# Patient Record
Sex: Female | Born: 1980
Health system: Southern US, Community
[De-identification: ages and names within clinical notes are randomized; demographics above are authoritative.]

## PROBLEM LIST (undated history)

## (undated) DIAGNOSIS — F329 Major depressive disorder, single episode, unspecified: Secondary | ICD-10-CM

## (undated) DIAGNOSIS — L509 Urticaria, unspecified: Secondary | ICD-10-CM

## (undated) DIAGNOSIS — T783XXA Angioneurotic edema, initial encounter: Secondary | ICD-10-CM

## (undated) DIAGNOSIS — I10 Essential (primary) hypertension: Secondary | ICD-10-CM

## (undated) DIAGNOSIS — E8881 Metabolic syndrome: Secondary | ICD-10-CM

## (undated) DIAGNOSIS — F32A Depression, unspecified: Secondary | ICD-10-CM

## (undated) DIAGNOSIS — E039 Hypothyroidism, unspecified: Secondary | ICD-10-CM

## (undated) DIAGNOSIS — M5430 Sciatica, unspecified side: Secondary | ICD-10-CM

## (undated) DIAGNOSIS — E079 Disorder of thyroid, unspecified: Secondary | ICD-10-CM

## (undated) HISTORY — DX: Morbid (severe) obesity due to excess calories: E66.01

## (undated) HISTORY — DX: Sciatica, unspecified side: M54.30

## (undated) HISTORY — DX: Hypothyroidism, unspecified: E03.9

## (undated) HISTORY — DX: Major depressive disorder, single episode, unspecified: F32.9

## (undated) HISTORY — DX: Angioneurotic edema, initial encounter: T78.3XXA

## (undated) HISTORY — DX: Metabolic syndrome: E88.81

## (undated) HISTORY — PX: WISDOM TOOTH EXTRACTION: SHX21

## (undated) HISTORY — DX: Urticaria, unspecified: L50.9

## (undated) HISTORY — DX: Metabolic syndrome: E88.810

## (undated) HISTORY — DX: Depression, unspecified: F32.A

---

## 2003-03-26 ENCOUNTER — Other Ambulatory Visit: Admission: RE | Admit: 2003-03-26 | Discharge: 2003-03-26 | Payer: Self-pay | Admitting: Obstetrics and Gynecology

## 2005-05-05 ENCOUNTER — Encounter: Admission: RE | Admit: 2005-05-05 | Discharge: 2005-05-15 | Payer: Self-pay | Admitting: Internal Medicine

## 2009-01-07 ENCOUNTER — Encounter (INDEPENDENT_AMBULATORY_CARE_PROVIDER_SITE_OTHER): Payer: Self-pay | Admitting: Internal Medicine

## 2009-01-07 ENCOUNTER — Ambulatory Visit (HOSPITAL_COMMUNITY): Admission: RE | Admit: 2009-01-07 | Discharge: 2009-01-07 | Payer: Self-pay | Admitting: Internal Medicine

## 2009-01-07 ENCOUNTER — Ambulatory Visit: Payer: Self-pay | Admitting: Cardiovascular Disease

## 2011-07-06 ENCOUNTER — Emergency Department (INDEPENDENT_AMBULATORY_CARE_PROVIDER_SITE_OTHER)
Admission: EM | Admit: 2011-07-06 | Discharge: 2011-07-06 | Disposition: A | Discharge status: Nursing Home (Includes ICF) | Payer: BC Managed Care – PPO | Source: Home / Self Care

## 2011-07-06 ENCOUNTER — Other Ambulatory Visit: Payer: Self-pay

## 2011-07-06 ENCOUNTER — Emergency Department (HOSPITAL_COMMUNITY): Payer: BC Managed Care – PPO

## 2011-07-06 ENCOUNTER — Encounter (HOSPITAL_COMMUNITY): Payer: Self-pay | Admitting: Emergency Medicine

## 2011-07-06 ENCOUNTER — Emergency Department (HOSPITAL_COMMUNITY)
Admission: EM | Admit: 2011-07-06 | Discharge: 2011-07-06 | Disposition: A | Discharge status: Home / Self Care | Payer: BC Managed Care – PPO | Attending: Emergency Medicine | Admitting: Emergency Medicine

## 2011-07-06 DIAGNOSIS — E079 Disorder of thyroid, unspecified: Secondary | ICD-10-CM | POA: Insufficient documentation

## 2011-07-06 DIAGNOSIS — I10 Essential (primary) hypertension: Secondary | ICD-10-CM | POA: Insufficient documentation

## 2011-07-06 DIAGNOSIS — R079 Chest pain, unspecified: Secondary | ICD-10-CM | POA: Insufficient documentation

## 2011-07-06 DIAGNOSIS — R0789 Other chest pain: Secondary | ICD-10-CM

## 2011-07-06 DIAGNOSIS — Z79899 Other long term (current) drug therapy: Secondary | ICD-10-CM | POA: Insufficient documentation

## 2011-07-06 HISTORY — DX: Disorder of thyroid, unspecified: E07.9

## 2011-07-06 HISTORY — DX: Morbid (severe) obesity due to excess calories: E66.01

## 2011-07-06 HISTORY — DX: Essential (primary) hypertension: I10

## 2011-07-06 LAB — CBC
MCV: 82.3 fL (ref 78.0–100.0)
Platelets: 281 10*3/uL (ref 150–400)
RBC: 5.08 MIL/uL (ref 3.87–5.11)
RDW: 14.4 % (ref 11.5–15.5)
WBC: 10.1 10*3/uL (ref 4.0–10.5)

## 2011-07-06 LAB — BASIC METABOLIC PANEL
CO2: 27 mEq/L (ref 19–32)
Chloride: 99 mEq/L (ref 96–112)
Creatinine, Ser: 0.71 mg/dL (ref 0.50–1.10)
GFR calc Af Amer: >90 mL/min (ref >90)
Potassium: 4.4 mEq/L (ref 3.5–5.1)
Sodium: 137 mEq/L (ref 135–145)

## 2011-07-06 LAB — D-DIMER, QUANTITATIVE: D-Dimer, Quant: 0.28 ug/mL-FEU (ref 0.00–0.48)

## 2011-07-06 LAB — POCT I-STAT TROPONIN I: Troponin i, poc: 0 ng/mL (ref 0.00–0.08)

## 2011-07-06 NOTE — ED Provider Notes (Signed)
Patient with intermittent episodes of chest pain for at least one month. Today, the pain was more prominent, so she decided to come here for evaluation. She has been seeing her PCP. He will regularly for treatment of thyroid disease. At this time. She states that the pain has resolved (22:40). She has no shortness of breath, dizziness, or weakness. Heart and lung exams are normal.  Patient is to be screened for PE. Due to persistent tachycardia, and obesity. If it is negative. She will be safe for discharge to followup with her PCP.  Medical screening examination/treatment/procedure(s) were conducted as a shared visit with non-physician practitioner(s) and myself.  I personally evaluated the patient during the encounter  Flint Melter, MD 07/06/11 2251

## 2011-07-06 NOTE — ED Notes (Signed)
Pt. Alert and oriented discharged to home via w/c with family, NAD noted

## 2011-07-06 NOTE — ED Notes (Signed)
PT HERE WITH ONGOING MID STERNAL ACHY PAIN RADIATING LEFT CHEST WALL AND UNDER BREAST THAT STARTED X 1 MNTH AGO BUT WORSENED LAST NIGHT WITH POSITION CHANGE WHILE SLEEPING.DENIES NUMB/TINGLING.PT WENT TO PCP LAST YR FOR SAME SX AND DIAG ENLARGED HEART/ACID REFLUX.HXHTN,MORBID OBESITY.BP ELEVATED 181/104 C/O BLURRY VISION BUT DENIES H/A.EKG DONE

## 2011-07-06 NOTE — ED Provider Notes (Signed)
Medical screening examination/treatment/procedure(s) were performed by non-physician practitioner and as supervising physician I was immediately available for consultation/collaboration.  Corrie Mckusick, MD 07/06/11 2215

## 2011-07-06 NOTE — ED Notes (Signed)
Pt from [facility] c/o CP and soreness x 1 month intermittently ; pt sts more severe x last 5 days; pt denies radiation, SOB or diaphoresis; pt sent for further eval

## 2011-07-06 NOTE — ED Provider Notes (Signed)
History     CSN: [id number]  Arrival date & time 07/06/11  1500   None     Chief Complaint  Patient presents with  . Chest Pain    (Consider location/radiation/quality/duration/timing/severity/associated sxs/prior treatment) HPI Comments: The patient presents today with complaints of intermittent soreness in chest for one month. Worse with movement. In the last 5 days it has become more of a pressure and "most of the day." She denies dyspnea, diaphoresis, or peripheral edema. She has had a mild nonproductive cough for the last one week. She also has had bilateral shoulder pain in the mornings when she awakens since December of 2012. She contributes this to sleeping on her couch. She states that she's been sleeping on her couch because she needs a new bed. She denies orthopnea. She has a history of hypertension and GERD. She denies a history of diabetes or hyperlipidemia. She also denies family history of cardiac disease. She had similar symptoms one year ago and states that she had a echocardiogram done which showed "a slightly enlarged heart". By chart review echocardiogram on 01/07/2009 showed mild LVH.  Patient states that she's restarted her omeprazole in the last 5 days and this does provide some mild relief of her symptoms.    Past Medical History  Diagnosis Date  . Hypertension   . Thyroid disease   . Morbid obesity     History reviewed. No pertinent past surgical history.  No family history on file.  History  Substance Use Topics  . Smoking status: Never Smoker   . Smokeless tobacco: Not on file  . Alcohol Use: Yes    OB History    Grav Para Term Preterm Abortions TAB SAB Ect Mult Living                  Review of Systems  Constitutional: Negative for fever, chills and fatigue.  HENT: Negative for ear pain, sore throat, rhinorrhea, sneezing, postnasal drip and sinus pressure.   Respiratory: Positive for cough. Negative for shortness of breath and wheezing.     Cardiovascular: Positive for chest pain. Negative for palpitations and leg swelling.  Gastrointestinal: Negative for nausea, vomiting and abdominal pain.    Allergies  Review of patient's allergies indicates not on file.  Home Medications   Current Outpatient Rx  Name Route Sig Dispense Refill  . LEVOTHYROXINE SODIUM 112 MCG PO TABS Oral Take 112 mcg by mouth daily.    Marland Kitchen LISINOPRIL-HYDROCHLOROTHIAZIDE 10-12.5 MG PO TABS Oral Take 1 tablet by mouth daily.    Marland Kitchen OMEPRAZOLE 20 MG PO CPDR Oral Take 20 mg by mouth daily.      BP 148/84  Pulse 133  Temp(Src) 98.9 F (37.2 C) (Oral)  Resp 21  Wt 418 lb (189.604 kg)  SpO2 95%  LMP 06/23/2011  Physical Exam  Nursing note and vitals reviewed. Constitutional: She appears well-developed and well-nourished. No distress.       Morbidly obese.  HENT:  Head: Normocephalic and atraumatic.  Right Ear: Tympanic membrane, external ear and ear canal normal.  Left Ear: Tympanic membrane, external ear and ear canal normal.  Nose: Nose normal.  Mouth/Throat: Uvula is midline, oropharynx is clear and moist and mucous membranes are normal. No oropharyngeal exudate, posterior oropharyngeal edema or posterior oropharyngeal erythema.  Neck: Neck supple.  Cardiovascular: Normal rate, regular rhythm and normal heart sounds.   Pulmonary/Chest: Effort normal and breath sounds normal. No respiratory distress. She exhibits no tenderness.  Abdominal: Normal appearance and  bowel sounds are normal. She exhibits no mass. There is no hepatosplenomegaly. There is no tenderness. There is no CVA tenderness.  Lymphadenopathy:    She has no cervical adenopathy.  Neurological: She is alert.  Skin: Skin is warm and dry.  Psychiatric: She has a normal mood and affect.    ED Course  Procedures (including critical care time)  Labs Reviewed - No data to display No results found.   1. Atypical chest pain   2. Hypertension   3. Morbid obesity       MDM   Transferred to [facility]. Atypical chest pain. Doubt cardiac but feel pt needs further evaluation due to symptoms, and risk factors. Discussed with Dr Juanetta Gosling.  EKG sinus tachycardia, possible Lt atrial enlargement, Rate [address] [place], [place] 07/06/11 1718

## 2011-07-06 NOTE — Discharge Instructions (Signed)
Chest Pain (Nonspecific) It is often hard to give a specific diagnosis for the cause of chest pain. There is always a chance that your pain could be related to something serious, such as a heart attack or a blood clot in the lungs. You need to follow up with your caregiver for further evaluation. CAUSES   Heartburn.   Pneumonia or bronchitis.   Anxiety and stress.   Inflammation around your heart (pericarditis) or lung (pleuritis or pleurisy).   A blood clot in the lung.   A collapsed lung (pneumothorax). It can develop suddenly on its own (spontaneous pneumothorax) or from injury (trauma) to the chest.  The chest wall is composed of bones, muscles, and cartilage. Any of these can be the source of the pain.  The bones can be bruised by injury.   The muscles or cartilage can be strained by coughing or overwork.   The cartilage can be affected by inflammation and become sore (costochondritis).  DIAGNOSIS  Lab tests or other studies, such as X-rays, an EKG, stress testing, or cardiac imaging, may be needed to find the cause of your pain.  TREATMENT   Treatment depends on what may be causing your chest pain. Treatment may include:   Acid blockers for heartburn.   Anti-inflammatory medicine.   Pain medicine for inflammatory conditions.   Antibiotics if an infection is present.   You may be advised to change lifestyle habits. This includes stopping smoking and avoiding caffeine and chocolate.   You may be advised to keep your head raised (elevated) when sleeping. This reduces the chance of acid going backward from your stomach into your esophagus.   Most of the time, nonspecific chest pain will improve within 2 to 3 days with rest and mild pain medicine.  HOME CARE INSTRUCTIONS   If antibiotics were prescribed, take the full amount even if you start to feel better.   For the next few days, avoid physical activities that bring on chest pain. Continue physical activities as  directed.   Do not smoke cigarettes or drink alcohol until your symptoms are gone.   Only take over-the-counter or prescription medicine for pain, discomfort, or fever as directed by your caregiver.   Follow your caregiver's suggestions for further testing if your chest pain does not go away.   Keep any follow-up appointments you made. If you do not go to an appointment, you could develop lasting (chronic) problems with pain. If there is any problem keeping an appointment, you must call to reschedule.  SEEK MEDICAL CARE IF:   You think you are having problems from the medicine you are taking. Read your medicine instructions carefully.   Your chest pain does not go away, even after treatment.   You develop a rash with blisters on your chest.  SEEK IMMEDIATE MEDICAL CARE IF:   You have increased chest pain or pain that spreads to your arm, neck, jaw, back, or belly (abdomen).   You develop shortness of breath, an increasing cough, or you are coughing up blood.   You have severe back or abdominal pain, feel sick to your stomach (nauseous) or throw up (vomit).   You develop severe weakness, fainting, or chills.   You have an oral temperature above 102 F (38.9 C), not controlled by medicine.  THIS IS AN EMERGENCY. Do not wait to see if the pain will go away. Get medical help at once. Call your local emergency services (911 in [place] Do not drive yourself to   the hospital. MAKE SURE YOU:   Understand these instructions.   Will watch your condition.   Will get help right away if you are not doing well or get worse.  Document Released: 02/09/2005 Document Revised: 01/12/2011 Document Reviewed: 12/06/2007 [employer] Patient Information 2012 [employer]. 

## 2011-07-06 NOTE — ED Provider Notes (Signed)
History     CSN: [id number]  Arrival date & time 07/06/11  1631   First MD Initiated Contact with Patient 07/06/11 2009      Chief Complaint  Patient presents with  . Chest Pain    (Consider location/radiation/quality/duration/timing/severity/associated sxs/prior treatment) HPI  Patient presents to the emergency department from the urgent care with complaints of chest pain x1 month. Patient has a history of GERD, and has been taking omeprazole for this problem however it has not been helping. She says that her pain is worse with movement, and over the past 5 days it has become more as a pressure. She denies dyspnea, diaphoresis, or peripheral edema. She has admitted to having a cough this past week. She states that she has been sleeping on the couch and denies orthopnea. Patient has a significant medical history of hypertension and GERD and she denies a family history of cardiac disease she denies having diabetes. Patient states that she had echocardiogram done in 2010 which showed that she had an enlarged heart. Chest pain resolved before coming to the ED  Past Medical History  Diagnosis Date  . Hypertension   . Thyroid disease   . Morbid obesity     History reviewed. No pertinent past surgical history.  History reviewed. No pertinent family history.  History  Substance Use Topics  . Smoking status: Never Smoker   . Smokeless tobacco: Not on file  . Alcohol Use: Yes    OB History    Grav Para Term Preterm Abortions TAB SAB Ect Mult Living                  Review of Systems  All other systems reviewed and are negative.    Allergies  Review of patient's allergies indicates no known allergies.  Home Medications   Current Outpatient Rx  Name Route Sig Dispense Refill  . LEVOTHYROXINE SODIUM 112 MCG PO TABS Oral Take 112 mcg by mouth daily.    Marland Kitchen LISINOPRIL-HYDROCHLOROTHIAZIDE 10-12.5 MG PO TABS Oral Take 1 tablet by mouth daily.    Marland Kitchen OMEPRAZOLE 20 MG PO CPDR Oral  Take 20 mg by mouth daily.      BP 161/108  Pulse 118  Temp(Src) 98.3 F (36.8 C) (Oral)  Resp 19  SpO2 98%  LMP 06/23/2011  Physical Exam  Nursing note and vitals reviewed. Constitutional: She is oriented to person, place, and time. She appears well-developed and well-nourished. No distress.  HENT:  Head: Normocephalic and atraumatic.  Eyes: Conjunctivae are normal. Pupils are equal, round, and reactive to light.  Neck: Trachea normal, normal range of motion and full passive range of motion without pain. Neck supple.  Cardiovascular: Normal rate, regular rhythm and normal pulses.   Pulmonary/Chest: Effort normal and breath sounds normal. She has no wheezes. She has no rales. Chest wall is not dull to percussion. She exhibits no tenderness, no crepitus, no edema, no deformity and no retraction.  Abdominal: Soft. Normal appearance.  Musculoskeletal: Normal range of motion.  Neurological: She is oriented to person, place, and time. She has normal strength.  Skin: Skin is warm, dry and intact. She is not diaphoretic.  Psychiatric: Her speech is normal. Cognition and memory are normal.    ED Course  Procedures (including critical care time)  Labs Reviewed  BASIC METABOLIC PANEL - Abnormal; Notable for the following:    Glucose, Bld 114 (*)    All other components within normal limits  CBC  POCT I-STAT TROPONIN I  D-DIMER, QUANTITATIVE   Dg Chest 2 View  07/06/2011  *RADIOLOGY REPORT*  Clinical Data: Mid chest pain for the past month.  CHEST - 2 VIEW  Comparison: None.  Findings: Normal sized heart.  Clear lungs with normal vascularity. Normal appearing bones.  IMPRESSION: Normal examination.  Original Report Authenticated By: Darrol Angel, M.D.     1. Chest pain       MDM  Patient is to followup with her primary care provider. D-dimer and troponin negative. Patient also examined by Dr. Effie Shy agrees with my treatment and plan.        Dorthula Matas,  PA 07/06/11 2316

## 2011-07-07 NOTE — ED Provider Notes (Signed)
Medical screening examination/treatment/procedure(s) were conducted as a shared visit with non-physician practitioner(s) and myself.  I personally evaluated the patient during the encounter  Flint Melter, MD 07/07/11 [phone]

## 2011-10-17 ENCOUNTER — Telehealth: Payer: Self-pay | Admitting: Obstetrics and Gynecology

## 2011-10-17 NOTE — Telephone Encounter (Signed)
Patients cht has already been pulled from storage

## 2011-11-14 ENCOUNTER — Ambulatory Visit (INDEPENDENT_AMBULATORY_CARE_PROVIDER_SITE_OTHER): Payer: BC Managed Care – PPO | Admitting: Obstetrics and Gynecology

## 2011-11-14 ENCOUNTER — Encounter: Payer: Self-pay | Admitting: Obstetrics and Gynecology

## 2011-11-14 VITALS — BP 110/78 | HR 98 | Ht 63.0 in | Wt >= 6400 oz

## 2011-11-14 DIAGNOSIS — Z01419 Encounter for gynecological examination (general) (routine) without abnormal findings: Secondary | ICD-10-CM

## 2011-11-14 DIAGNOSIS — Z124 Encounter for screening for malignant neoplasm of cervix: Secondary | ICD-10-CM

## 2011-11-14 DIAGNOSIS — E039 Hypothyroidism, unspecified: Secondary | ICD-10-CM

## 2011-11-14 NOTE — Progress Notes (Signed)
Last Pap: 2002 per pt WNL: Yes Regular Periods:yes Contraception: abstinence   Monthly Breast exam:yes Tetanus<4yrs:no Nl.Bladder Function:yes Daily BMs:yes Healthy Diet:no Pt states she is starting Calcium:no Mammogram:no Date of Mammogram:  Exercise:yes, Pt states she has started  Have often Exercise: twice per week  Seatbelt: no, occasionally per pt  Abuse at home: no Stressful work:no Sigmoid-colonoscopy: n/a Bone Density: No PCP: Dr. Allyne Gee  Change in PMH: n/a Change in FMH:n/a   No complaints  Filed Vitals:   11/14/11 0938  BP: 110/78  Pulse: 98   ROS: noncontributory  Physical Examination: General appearance - alert, well appearing, and in no distress Neck - supple, no significant adenopathy Chest - clear to auscultation, no wheezes, rales or rhonchi, symmetric air entry Heart - normal rate and regular rhythm Abdomen - soft, nontender, nondistended, no masses or organomegaly Breasts - breasts appear normal, no suspicious masses, no skin or nipple changes or axillary nodes Pelvic - normal external genitalia, vulva, vagina, cervix, difficult to palpate uterus and adnexa secondary to habitus Back exam - no CVAT Extremities - no edema, redness or tenderness in the calves or thighs  A/P Morbid obesity Rec u/s to eval ovaries secondary to habitus RTO after u/s

## 2011-11-15 LAB — PAP IG W/ RFLX HPV ASCU

## 2011-12-06 ENCOUNTER — Other Ambulatory Visit: Payer: BC Managed Care – PPO

## 2011-12-06 ENCOUNTER — Encounter: Payer: BC Managed Care – PPO | Admitting: Obstetrics and Gynecology

## 2012-05-13 ENCOUNTER — Encounter (HOSPITAL_COMMUNITY): Payer: Self-pay | Admitting: Emergency Medicine

## 2012-05-13 DIAGNOSIS — R079 Chest pain, unspecified: Secondary | ICD-10-CM | POA: Insufficient documentation

## 2012-05-13 DIAGNOSIS — Z79899 Other long term (current) drug therapy: Secondary | ICD-10-CM | POA: Insufficient documentation

## 2012-05-13 DIAGNOSIS — E039 Hypothyroidism, unspecified: Secondary | ICD-10-CM | POA: Insufficient documentation

## 2012-05-13 DIAGNOSIS — I10 Essential (primary) hypertension: Secondary | ICD-10-CM | POA: Insufficient documentation

## 2012-05-13 NOTE — ED Notes (Signed)
Pt c/o upper chest pain onset 4 days ago.  Also st's she has had a non-productive cough.  Pt denies shortness of breath, nausea, vomiting or diaphoresis

## 2012-05-14 ENCOUNTER — Emergency Department (HOSPITAL_COMMUNITY): Payer: BC Managed Care – PPO

## 2012-05-14 ENCOUNTER — Emergency Department (HOSPITAL_COMMUNITY)
Admission: EM | Admit: 2012-05-14 | Discharge: 2012-05-14 | Disposition: A | Discharge status: Home / Self Care | Payer: BC Managed Care – PPO | Attending: Emergency Medicine | Admitting: Emergency Medicine

## 2012-05-14 DIAGNOSIS — R079 Chest pain, unspecified: Secondary | ICD-10-CM

## 2012-05-14 LAB — BASIC METABOLIC PANEL
BUN: 8 mg/dL (ref 6–23)
Calcium: 9.5 mg/dL (ref 8.4–10.5)
Chloride: 99 mEq/L (ref 96–112)
Creatinine, Ser: 0.76 mg/dL (ref 0.50–1.10)
GFR calc Af Amer: >90 mL/min (ref >90)

## 2012-05-14 LAB — CBC
HCT: 40.9 % (ref 36.0–46.0)
MCH: 27.7 pg (ref 26.0–34.0)
MCHC: 32.5 g/dL (ref 30.0–36.0)
MCV: 85 fL (ref 78.0–100.0)
Platelets: 244 10*3/uL (ref 150–400)
RDW: 14.2 % (ref 11.5–15.5)

## 2012-05-14 MED ORDER — IBUPROFEN 800 MG PO TABS: 800.0000 mg | ORAL_TABLET | Freq: Three times a day (TID) | ORAL | Status: DC

## 2012-05-14 MED ORDER — BENZONATATE 100 MG PO CAPS
200.0000 mg | ORAL_CAPSULE | Freq: Once | ORAL | Status: AC
  Administered 2012-05-14: 200 mg via ORAL
  Filled 2012-05-14: qty 2

## 2012-05-14 MED ORDER — BENZONATATE 100 MG PO CAPS: 100.0000 mg | ORAL_CAPSULE | Freq: Three times a day (TID) | ORAL | Status: DC

## 2012-05-14 NOTE — ED Provider Notes (Signed)
History     CSN: [id number]  Arrival date & time 05/13/12  2344   First MD Initiated Contact with Patient 05/14/12 0110      Chief Complaint  Patient presents with  . Chest Pain    (Consider location/radiation/quality/duration/timing/severity/associated sxs/prior treatment) HPI Hx per PT, woke up this am and then developed on and off sharp chest pains substernal, has had this before and has had echo in the past, has never had a stress test. Pain lasts seconds and goes away, no radiation of pain, some dry cough no fevers. No FH of CAD, is a non smoker, is treated for HTN. No h/o DM or HLD. Followed by Dr Allyne Gee, PT was told she may have anxiety. No known aggrevating or alleviating factors. No leg pain or swelling. No pain currently.  Past Medical History  Diagnosis Date  . Hypertension   . Thyroid disease   . Morbid obesity   . Hypothyroidism   . Obesity, morbid   . Dysmetabolic syndrome     Past Surgical History  Procedure Date  . Wisdom tooth extraction     Family History  Problem Relation Age of Onset  . Hypertension Mother   . Migraines Mother   . Autoimmune disease Mother   . Cancer Maternal Grandfather     Colon  . Cancer Paternal Grandmother     Breast  . Diabetes Paternal Uncle   . Kidney disease Paternal Uncle   . Diabetes Paternal Aunt   . Migraines Maternal Aunt     History  Substance Use Topics  . Smoking status: Never Smoker   . Smokeless tobacco: Not on file  . Alcohol Use: Yes     Comment: rarely     OB History    Grav Para Term Preterm Abortions TAB SAB Ect Mult Living   0               Review of Systems  Constitutional: Negative for fever and chills.  HENT: Negative for neck pain and neck stiffness.   Eyes: Negative for pain.  Respiratory: Negative for chest tightness and shortness of breath.   Cardiovascular: Positive for chest pain.  Gastrointestinal: Negative for abdominal pain.  Genitourinary: Negative for dysuria.    Musculoskeletal: Negative for back pain.  Skin: Negative for rash.  Neurological: Negative for headaches.  All other systems reviewed and are negative.    Allergies  Review of patient's allergies indicates no known allergies.  Home Medications   Current Outpatient Rx  Name  Route  Sig  Dispense  Refill  . LEVOTHYROXINE SODIUM 112 MCG PO TABS   Oral   Take 112 mcg by mouth daily.         Marland Kitchen LISINOPRIL-HYDROCHLOROTHIAZIDE 10-12.5 MG PO TABS   Oral   Take 1 tablet by mouth daily.         Marland Kitchen OMEPRAZOLE 20 MG PO CPDR   Oral   Take 20 mg by mouth daily.         Marland Kitchen PHENTERMINE-TOPIRAMATE 3.75-23 MG PO CP24   Oral   Take by mouth.           BP 131/97  Pulse 105  Temp 98.7 F (37.1 C) (Oral)  Resp 18  SpO2 97%  LMP 05/13/2012  Physical Exam  Constitutional: She is oriented to person, place, and time. She appears well-developed and well-nourished.  HENT:  Head: Normocephalic and atraumatic.  Eyes: Conjunctivae normal and EOM are normal. Pupils are equal, round, and  reactive to light.  Neck: Trachea normal. Neck supple. No thyromegaly present.  Cardiovascular: Normal rate, regular rhythm, S1 normal, S2 normal and normal pulses.     No systolic murmur is present   No diastolic murmur is present  Pulses:      Radial pulses are 2+ on the right side, and 2+ on the left side.  Pulmonary/Chest: Effort normal and breath sounds normal. She has no wheezes. She has no rhonchi. She has no rales. She exhibits no tenderness.  Abdominal: Soft. Normal appearance and bowel sounds are normal. There is no tenderness. There is no CVA tenderness and negative Murphy's sign.  Musculoskeletal:       BLE:s Calves nontender, no cords or erythema, negative Homans sign  Neurological: She is alert and oriented to person, place, and time. She has normal strength. No cranial nerve deficit or sensory deficit. GCS eye subscore is 4. GCS verbal subscore is 5. GCS motor subscore is 6.  Skin: Skin  is warm and dry. No rash noted. She is not diaphoretic.  Psychiatric: Her speech is normal.       Cooperative and appropriate    ED Course  Procedures (including critical care time)  Results for orders placed during the hospital encounter of 05/14/12  TROPONIN I      Component Value Range   Troponin I <0.30  <0.30 ng/mL  CBC      Component Value Range   WBC 7.8  4.0 - 10.5 K/uL   RBC 4.81  3.87 - 5.11 MIL/uL   Hemoglobin 13.3  12.0 - 15.0 g/dL   HCT 84.1  32.4 - 40.1 %   MCV 85.0  78.0 - 100.0 fL   MCH 27.7  26.0 - 34.0 pg   MCHC 32.5  30.0 - 36.0 g/dL   RDW 02.7  25.3 - 66.4 %   Platelets 244  150 - 400 K/uL  BASIC METABOLIC PANEL      Component Value Range   Sodium 138  135 - 145 mEq/L   Potassium 3.7  3.5 - 5.1 mEq/L   Chloride 99  96 - 112 mEq/L   CO2 29  19 - 32 mEq/L   Glucose, Bld 114 (*) 70 - 99 mg/dL   BUN 8  6 - 23 mg/dL   Creatinine, Ser 4.03  0.50 - 1.10 mg/dL   Calcium 9.5  8.4 - 47.4 mg/dL   GFR calc non Af Amer >90  >90 mL/min   GFR calc Af Amer >90  >90 mL/min   Dg Chest 2 View  05/14/2012  *RADIOLOGY REPORT*  Clinical Data: Bilateral chest pain and right arm, shoulder and neck pain.  CHEST - 2 VIEW  Comparison: Chest radiograph from 07/06/2011  Findings: The lungs are well-aerated and clear.  There is no evidence of focal opacification, pleural effusion or pneumothorax.  The heart is normal in size; the mediastinal contour is within normal limits.  No acute osseous abnormalities are seen.  IMPRESSION: No acute cardiopulmonary process seen.   Original Report Authenticated By: Tonia Ghent, M.D.       Date: 05/14/2012  Rate: 103   Rhythm: sinus tachycardia  QRS Axis: normal  Intervals: normal  ST/T Wave abnormalities: nonspecific ST/T changes  Conduction Disutrbances:none  Narrative Interpretation:   Old EKG Reviewed: none available   Tessalon Perles provided for cough. Chest x-ray, EKG and labs reviewed. On recheck feels much better and has not  had any recurrent chest pain in the emergency department.  plan discharge home followup with primary care physician for recheck and to schedule outpatient stress testing. Prescription for Tessalon provided. No fevers, sore throat or clinical influenza MDM       CP - sharp and intermittent, does not suggest ACS. Has some dry cough but no there URI symptoms. Evaluated with ECG, CXR, and labs as above. Tessalon provided. Vital Signs and nursing notes reviewed.    Sunnie Nielsen, MD 05/14/12 [phone]

## 2012-06-01 ENCOUNTER — Encounter (HOSPITAL_COMMUNITY): Payer: Self-pay | Admitting: *Deleted

## 2012-06-01 ENCOUNTER — Emergency Department (HOSPITAL_COMMUNITY)
Admission: EM | Admit: 2012-06-01 | Discharge: 2012-06-01 | Disposition: A | Discharge status: Home / Self Care | Payer: BC Managed Care – PPO | Source: Home / Self Care

## 2012-06-01 DIAGNOSIS — L03019 Cellulitis of unspecified finger: Secondary | ICD-10-CM

## 2012-06-01 MED ORDER — AMOXICILLIN-POT CLAVULANATE 875-125 MG PO TABS: 1.0000 | ORAL_TABLET | Freq: Two times a day (BID) | ORAL | Status: DC

## 2012-06-01 MED ORDER — HYDROCODONE-ACETAMINOPHEN 7.5-325 MG PO TABS: 1.0000 | ORAL_TABLET | Freq: Four times a day (QID) | ORAL | Status: DC | PRN

## 2012-06-01 NOTE — ED Provider Notes (Signed)
Medical screening examination/treatment/procedure(s) were performed by resident physician or non-physician practitioner and as supervising physician I was immediately available for consultation/collaboration.   Barkley Bruns MD.    Linna Hoff, MD 06/01/12 2028

## 2012-06-01 NOTE — ED Notes (Signed)
Patient not triaged by this nurse

## 2012-06-01 NOTE — ED Notes (Signed)
Pt has infected right index finger - placed in iodine soak at triage

## 2012-06-01 NOTE — ED Provider Notes (Addendum)
History     CSN: [id number]  Arrival date & time 06/01/12  1005   First MD Initiated Contact with Patient 06/01/12 1031      Chief Complaint  Patient presents with  . Wound Infection    (Consider location/radiation/quality/duration/timing/severity/associated sxs/prior treatment) HPI Comments: [age] female states that she bites her nails. Last night she noticed discomfort to the left distal index finger and this morning upon awakening she noticed there was swelling erythema and pain around the nail of the index finger. The redness and swelling does not extend to the IP joint and it is not circumferential. No evidence of felon. Denies constitutional symptoms. Distal neurovascular and motor sensory is intact.   Past Medical History  Diagnosis Date  . Hypertension   . Thyroid disease   . Morbid obesity   . Hypothyroidism   . Obesity, morbid   . Dysmetabolic syndrome     Past Surgical History  Procedure Date  . Wisdom tooth extraction     Family History  Problem Relation Age of Onset  . Hypertension Mother   . Migraines Mother   . Autoimmune disease Mother   . Cancer Maternal Grandfather     Colon  . Cancer Paternal Grandmother     Breast  . Diabetes Paternal Uncle   . Kidney disease Paternal Uncle   . Diabetes Paternal Aunt   . Migraines Maternal Aunt     History  Substance Use Topics  . Smoking status: Never Smoker   . Smokeless tobacco: Not on file  . Alcohol Use: Yes     Comment: rarely     OB History    Grav Para Term Preterm Abortions TAB SAB Ect Mult Living   0               Review of Systems  Cardiovascular: Negative for chest pain.  Genitourinary: Negative.   Musculoskeletal: Negative.   Skin:       As per history of present illness  Neurological: Negative.   All other systems reviewed and are negative.    Allergies  Review of patient's allergies indicates no known allergies.  Home Medications   Current Outpatient Rx  Name  Route  Sig   Dispense  Refill  . AMOXICILLIN-POT CLAVULANATE 875-125 MG PO TABS   Oral   Take 1 tablet by mouth every 12 (twelve) hours.   14 tablet   0   . BENZONATATE 100 MG PO CAPS   Oral   Take 1 capsule (100 mg total) by mouth every 8 (eight) hours.   21 capsule   0   . GUAIFENESIN 100 MG/5ML PO LIQD   Oral   Take 200 mg by mouth 3 (three) times daily as needed. For cough and congestion         . HYDROCODONE-ACETAMINOPHEN 7.5-325 MG PO TABS   Oral   Take 1 tablet by mouth every 6 (six) hours as needed for pain.   12 tablet   0   . IBUPROFEN 800 MG PO TABS   Oral   Take 1 tablet (800 mg total) by mouth 3 (three) times daily.   21 tablet   0   . LEVOTHYROXINE SODIUM 112 MCG PO TABS   Oral   Take 112 mcg by mouth daily.         Marland Kitchen LISINOPRIL-HYDROCHLOROTHIAZIDE 10-12.5 MG PO TABS   Oral   Take 1 tablet by mouth daily.         Marland Kitchen  OMEPRAZOLE 20 MG PO CPDR   Oral   Take 20 mg by mouth daily.         Marland Kitchen PHENTERMINE-TOPIRAMATE 3.75-23 MG PO CP24   Oral   Take by mouth.         Marland Kitchen VITAMIN D (ERGOCALCIFEROL) 50000 UNITS PO CAPS   Oral   Take 50,000 Units by mouth 2 (two) times a week.           LMP 05/13/2012  Physical Exam  Constitutional: She is oriented to person, place, and time. She appears well-developed and well-nourished. No distress.  Neck: Normal range of motion. Neck supple.  Pulmonary/Chest: Effort normal.  Musculoskeletal: Normal range of motion.  Neurological: She is alert and oriented to person, place, and time. She exhibits normal muscle tone.  Skin: Skin is warm and dry.       Fluctuant swelling along the cuticle of the nail as well as the ulnar aspect of the fingernail. There is mild swelling and erythema does not extend beyond 50% of the distal phalanx. Flexion and extension of the distal phalanx is intact the    ED Course  Drain paronychia Date/Time: 06/01/2012 11:13 AM Performed by: Phineas Real, Verdine Grenfell Authorized by: Bradd Canary D Consent:  Verbal consent obtained. Risks and benefits: risks, benefits and alternatives were discussed Consent given by: patient Patient understanding: patient states understanding of the procedure being performed Patient identity confirmed: verbally with patient Preparation: Patient was prepped and draped in the usual sterile fashion. Local anesthesia used: yes Anesthesia: local infiltration Local anesthetic: lidocaine 2% without epinephrine Patient sedated: no Patient tolerance: Patient tolerated the procedure well with no immediate complications. Comments: 2 separate pockets of purulence or drainage. One at the cuticle separate warm and the older aspect of the nail. A copious amount of purulence was expressed along with blood.   (including critical care time)  Labs Reviewed - No data to display No results found.   1. Paronychia of finger       MDM  Augmentin 875 twice a day for 7 day Norco 7.5 Q4H when necessary pain #12 Soak the finger in warm salty water 2-4 times a day for the next 2-3 days. For any worsening new symptoms problems or not responding to the antibiotics may return. 06/04/12. Culture grew MRSA. Will call pt and have her stop the Augmentin and start Septra DS 1 bid for 10 days. The Rx was e-scribed by [facility] Verbal and written instructions given to [facility].         Hayden Rasmussen, NP 06/01/12 1115  Hayden Rasmussen, NP 06/04/12 1949  Hayden Rasmussen, NP 06/04/12 1954

## 2012-06-03 LAB — CULTURE, ROUTINE-ABSCESS

## 2012-06-04 ENCOUNTER — Telehealth (HOSPITAL_COMMUNITY): Payer: Self-pay | Admitting: *Deleted

## 2012-06-04 MED ORDER — SULFAMETHOXAZOLE-TRIMETHOPRIM 800-160 MG PO TABS: 1.0000 | ORAL_TABLET | Freq: Two times a day (BID) | ORAL | Status: DC

## 2012-06-04 NOTE — ED Notes (Signed)
Abscess culture L finger: Abundant MRSA.  Hayden Rasmussen NP wants pt. to stop Augmentin and start Septra DS.  Rx. E-prescribed to [facility] on [address]. I called pt.  Pt. verified x 2 and given results. Pt. Given NP's instructions and MRSA instructions. Pt. told where to get her Rx. She said they are closed now but will pick it up in the AM. Pt.'s questions answered. Pt. voiced understanding. Vassie Moselle 06/04/2012

## 2012-06-05 NOTE — ED Provider Notes (Signed)
Medical screening examination/treatment/procedure(s) were performed by resident physician or non-physician practitioner and as supervising physician I was immediately available for consultation/collaboration.   KINDL,JAMES DOUGLAS MD.    James D Kindl, MD 06/05/12 1610 

## 2012-06-05 NOTE — ED Notes (Signed)
Call from patient, advised to use Rx as advised, and to be rechecked if any further issues

## 2012-06-15 NOTE — ED Notes (Signed)
Patient called w questions regarding MRSA; discussed self care and future MRSA occurances

## 2012-06-30 ENCOUNTER — Other Ambulatory Visit: Payer: Self-pay

## 2013-03-21 ENCOUNTER — Other Ambulatory Visit: Payer: Self-pay

## 2013-05-28 ENCOUNTER — Emergency Department (HOSPITAL_COMMUNITY)
Admission: EM | Admit: 2013-05-28 | Discharge: 2013-05-28 | Disposition: A | Discharge status: Home / Self Care | Payer: BC Managed Care – PPO | Source: Home / Self Care | Attending: Family Medicine | Admitting: Family Medicine

## 2013-05-28 ENCOUNTER — Encounter (HOSPITAL_COMMUNITY): Payer: Self-pay | Admitting: Emergency Medicine

## 2013-05-28 DIAGNOSIS — J111 Influenza due to unidentified influenza virus with other respiratory manifestations: Secondary | ICD-10-CM

## 2013-05-28 DIAGNOSIS — R69 Illness, unspecified: Principal | ICD-10-CM

## 2013-05-28 MED ORDER — GUAIFENESIN-CODEINE 100-10 MG/5ML PO SYRP: 10.0000 mL | ORAL_SOLUTION | Freq: Four times a day (QID) | ORAL | Status: DC | PRN

## 2013-05-28 NOTE — ED Provider Notes (Signed)
CSN: [id number]     Arrival date & time 05/28/13  1149 History   First MD Initiated Contact with Patient 05/28/13 1249     Chief Complaint  Patient presents with  . URI   (Consider location/radiation/quality/duration/timing/severity/associated sxs/prior Treatment) Patient is a 33 y.o. female presenting with URI. The history is provided by the patient.  URI Presenting symptoms: congestion, cough, fever and rhinorrhea   Severity:  Mild Onset quality:  Sudden Duration:  5 days Associated symptoms: myalgias   Risk factors: sick contacts     Past Medical History  Diagnosis Date  . Hypertension   . Thyroid disease   . Morbid obesity   . Hypothyroidism   . Obesity, morbid   . Dysmetabolic syndrome    Past Surgical History  Procedure Laterality Date  . Wisdom tooth extraction     Family History  Problem Relation Age of Onset  . Hypertension Mother   . Migraines Mother   . Autoimmune disease Mother   . Cancer Maternal Grandfather     Colon  . Cancer Paternal Grandmother     Breast  . Diabetes Paternal Uncle   . Kidney disease Paternal Uncle   . Diabetes Paternal Aunt   . Migraines Maternal Aunt    History  Substance Use Topics  . Smoking status: Never Smoker   . Smokeless tobacco: Not on file  . Alcohol Use: Yes     Comment: rarely    OB History   Grav Para Term Preterm Abortions TAB SAB Ect Mult Living   0              Review of Systems  Constitutional: Positive for fever.  HENT: Positive for congestion and rhinorrhea.   Respiratory: Positive for cough.   Cardiovascular: Negative.   Gastrointestinal: Negative.   Genitourinary: Negative.   Musculoskeletal: Positive for myalgias.    Allergies  Review of patient's allergies indicates no known allergies.  Home Medications   Current Outpatient Rx  Name  Route  Sig  Dispense  Refill  . levothyroxine (SYNTHROID, LEVOTHROID) 112 MCG tablet   Oral   Take 112 mcg by mouth daily.         Marland Kitchen  lisinopril-hydrochlorothiazide (PRINZIDE,ZESTORETIC) 10-12.5 MG per tablet   Oral   Take 1 tablet by mouth daily.         Marland Kitchen omeprazole (PRILOSEC) 20 MG capsule   Oral   Take 20 mg by mouth daily.         . Phentermine-Topiramate (QSYMIA) 3.75-23 MG CP24   Oral   Take by mouth.         . Vitamin D, Ergocalciferol, (DRISDOL) 50000 UNITS CAPS   Oral   Take 50,000 Units by mouth 2 (two) times a week.         Marland Kitchen amoxicillin-clavulanate (AUGMENTIN) 875-125 MG per tablet   Oral   Take 1 tablet by mouth every 12 (twelve) hours.   14 tablet   0   . benzonatate (TESSALON) 100 MG capsule   Oral   Take 1 capsule (100 mg total) by mouth every 8 (eight) hours.   21 capsule   0   . guaiFENesin (ROBITUSSIN) 100 MG/5ML liquid   Oral   Take 200 mg by mouth 3 (three) times daily as needed. For cough and congestion         . guaiFENesin-codeine (ROBITUSSIN AC) 100-10 MG/5ML syrup   Oral   Take 10 mLs by mouth 4 (four) times daily as  needed for cough.   180 mL   0   . HYDROcodone-acetaminophen (NORCO) 7.5-325 MG per tablet   Oral   Take 1 tablet by mouth every 6 (six) hours as needed for pain.   12 tablet   0   . ibuprofen (ADVIL,MOTRIN) 800 MG tablet   Oral   Take 1 tablet (800 mg total) by mouth 3 (three) times daily.   21 tablet   0   . sulfamethoxazole-trimethoprim (SEPTRA DS) 800-160 MG per tablet   Oral   Take 1 tablet by mouth 2 (two) times daily. X 10 days   20 tablet   0    BP 117/55  Pulse 97  Temp(Src) 98.8 F (37.1 C) (Oral)  Resp 24  SpO2 97%  LMP 05/03/2013 Physical Exam  Nursing note and vitals reviewed. Constitutional: She is oriented to person, place, and time. She appears well-developed and well-nourished.  HENT:  Right Ear: External ear normal.  Left Ear: External ear normal.  Mouth/Throat: Oropharynx is clear and moist.  Neck: Normal range of motion. Neck supple.  Pulmonary/Chest: Effort normal and breath sounds normal.   Lymphadenopathy:    She has no cervical adenopathy.  Neurological: She is alert and oriented to person, place, and time.  Skin: Skin is warm and dry.    ED Course  Procedures (including critical care time) Labs Review Labs Reviewed - No data to display Imaging Review No results found.  EKG Interpretation    Date/Time:    Ventricular Rate:    PR Interval:    QRS Duration:   QT Interval:    QTC Calculation:   R Axis:     Text Interpretation:              MDM      Linna HoffJames D Perry Brucato, MD 05/28/13 1400

## 2013-05-28 NOTE — Discharge Instructions (Signed)
Drink plenty of fluids as discussed, use medicine as prescribed and mucinex  for cough. Return or see your doctor if further problems

## 2013-05-28 NOTE — ED Notes (Signed)
C/o  Fever on 1/8-1/10.   Nonproductive cough.  Sneezing.  Sinus pressure and pain.  Pt has used Robitussin DM, day/night quill with mild relief in symptoms.  Denies n/v/d

## 2016-01-05 ENCOUNTER — Institutional Professional Consult (permissible substitution): Payer: BLUE CROSS/BLUE SHIELD | Admitting: Neurology

## 2016-02-02 ENCOUNTER — Ambulatory Visit (INDEPENDENT_AMBULATORY_CARE_PROVIDER_SITE_OTHER): Payer: BLUE CROSS/BLUE SHIELD | Admitting: Neurology

## 2016-02-02 ENCOUNTER — Encounter: Payer: Self-pay | Admitting: Neurology

## 2016-02-02 VITALS — BP 124/88 | HR 100 | Resp 20 | Ht 63.0 in | Wt >= 6400 oz

## 2016-02-02 DIAGNOSIS — E662 Morbid (severe) obesity with alveolar hypoventilation: Secondary | ICD-10-CM | POA: Diagnosis not present

## 2016-02-02 DIAGNOSIS — E669 Obesity, unspecified: Secondary | ICD-10-CM

## 2016-02-02 DIAGNOSIS — G4730 Sleep apnea, unspecified: Secondary | ICD-10-CM | POA: Diagnosis not present

## 2016-02-02 DIAGNOSIS — F5102 Adjustment insomnia: Secondary | ICD-10-CM | POA: Diagnosis not present

## 2016-02-02 DIAGNOSIS — G4710 Hypersomnia, unspecified: Secondary | ICD-10-CM | POA: Diagnosis not present

## 2016-02-02 DIAGNOSIS — R0683 Snoring: Secondary | ICD-10-CM | POA: Diagnosis not present

## 2016-02-02 NOTE — Progress Notes (Signed)
SLEEP MEDICINE CLINIC   Provider:  Melvyn Novas, M D  Referring Provider: Dorothyann Peng, MD Primary Care Physician:  Gwynneth Aliment, MD  Chief Complaint  Patient presents with  . New Patient (Initial Visit)    had sleep study, was on cpap but it got recalled    HPI:  Sarah Barton is a 35 y.o. female , seen here as a referral/ from Dr. Allyne Gee for an evaluation of sleep apnea, in a  high risk patient  ( for  OSA and Hypoventilation).   Chief complaint according to patient : [name] reports poor sleep quality. She has trouble falling asleep not necessarily staying asleep. Her sleep Lex restorative refreshing quality.  The patient also reports that she frequently wakes up with headaches she has insomnia she has been known to snore and she is considered super obese with a high risk of obesity hypoventilation. Her current weight is 438 pounds at a height of 5 foot 4 inches. Dr. Lelon Perla requested a split-night polysomnography to evaluate the patient's apnea degree of apnea and best treatment options she also mentioned that the patient suffers from hypothyroidism and benign essential hypertension has some degenerative joint disease related to weight, a history of a single major depressive episode. She has had an MRSA related infection in a finger.  In 2008 Mrs. Branch underwent a sleep study at the [facility] and  sleep center, she was diagnosed with an AHI of 37.2 and during REM sleep her apnea index was exacerbated to 80 per hour. The patient was titrated to 10 cm CPAP the interpreting physician was Dr. Primus Bravo, MD.  A little over 3 years ago the patient was re-evaluated for sleep apnea at an outside lab, and re-diagnosed with OSA . She is not using CPAP but she was issued a CPAP initially, the machine got recalled due - ? to compliance ?Marland Kitchen   Sleep habits are as follows: Usually [name] is in bed and going to sleep by 1 AM. She sleeps with the TV on, the bedroom is cool but not  dark. He does not like to sleep in the dark. She sleeps alone in her bedroom, prefers the prone or lateral sleep position. She uses 3 pillows. She will wake up for the first time around 3 AM to go to the bathroom, he rises at 6 AM to go to work. She needs to rely on an alarm even that she is awake before the alarm rings, hits the snooze button several times. Some nights she will not sleep at all. On average she will sleep less than 6 hours at night. In the morning she wakens with a dry mouth, headaches, joint or muscle stiffness. She does not feel refreshed or restored. She is usually not woken by headaches or discomfort. On weekends she may take naps in daytime, but never on week days. She usually picks up a snack for breakfast on her way to work, with which will contain a caffeinated beverage. Her work is usually very busy and she does not drink any other caffeine during the day. She returns home after work at [employer], often feeling very tired.  Sleep medical history and family sleep history:  OSA diagnosed 9 years ago, but no CPAP available.  In childhood she was not sleepier than her peers, no  sleepwalking, was scared in the dark.    Social history: single,  No children. Teacher pre K.  Non smoker, non drinker, caffeine in moderation.  Review of Systems:  Out of a complete 14 system review, the patient complains of only the following symptoms, and all other reviewed systems are negative.  Snoring, fatigue, weight gain, joint pain, tinnitus.   Epworth score 7 , Fatigue severity score 22  , depression score 2/15    Social History   Social History  . Marital status: Single    Spouse name: [name]  . Number of children: [name]  . Years of education: [name]   Occupational History  . Not on file.   Social History Main Topics  . Smoking status: Never Smoker  . Smokeless tobacco: Not on file  . Alcohol use Yes     Comment: rarely   . Drug use: No  . Sexual activity: No   Other Topics  Concern  . Not on file   Social History Narrative  . No narrative on file    Family History  Problem Relation Age of Onset  . Hypertension Mother   . Migraines Mother   . Autoimmune disease Mother   . Cancer Maternal Grandfather     Colon  . Cancer Paternal Grandmother     Breast  . Diabetes Paternal Uncle   . Kidney disease Paternal Uncle   . Diabetes Paternal Aunt   . Migraines Maternal Aunt     Past Medical History:  Diagnosis Date  . Depression   . Dysmetabolic syndrome   . Hypertension   . Hypothyroidism   . Morbid obesity (HCC)   . Obesity, morbid (HCC)   . Sciatica   . Thyroid disease     Past Surgical History:  Procedure Laterality Date  . WISDOM TOOTH EXTRACTION      Current Outpatient Prescriptions  Medication Sig Dispense Refill  . ALPRAZolam (XANAX) 0.25 MG tablet Take 0.25 mg by mouth 2 (two) times daily as needed.  0  . Eszopiclone 3 MG TABS Take 3 mg by mouth at bedtime as needed.  0  . lisinopril-hydrochlorothiazide (PRINZIDE,ZESTORETIC) 10-12.5 MG per tablet Take 1 tablet by mouth daily.    Marland Kitchen omeprazole (PRILOSEC) 20 MG capsule Take 20 mg by mouth daily.    . sertraline (ZOLOFT) 50 MG tablet Take 50 mg by mouth daily.  1  . SYNTHROID 125 MCG tablet Take 125 mcg by mouth daily.  3  . Vitamin D, Ergocalciferol, (DRISDOL) 50000 UNITS CAPS Take 50,000 Units by mouth 2 (two) times a week.     No current facility-administered medications for this visit.     Allergies as of 02/02/2016  . (No Known Allergies)    Vitals: BP 124/88   Pulse 100   Resp 20   Ht 5\' 3"  (1.6 m)   Wt (!) 440 lb (199.6 kg)   BMI 77.94 kg/m  Last Weight:  Wt Readings from Last 1 Encounters:  02/02/16 (!) 440 lb (199.6 kg)   ZHY:QMVH mass index is 77.94 kg/m.     Last Height:   Ht Readings from Last 1 Encounters:  02/02/16 5\' 3"  (1.6 m)    Physical exam:  General: The patient is awake, alert and appears not in acute distress. The patient is well groomed. Head:  Normocephalic, atraumatic. Neck is supple. Mallampati 5,  neck circumference:17 Nasal airflow patent , TMJ is evident . Retrognathia is seen.  Cardiovascular:  Regular rate and rhythm , without  murmurs or carotid bruit, and without distended neck veins. Respiratory: Lungs are clear to auscultation. Skin:  Without evidence of edema, or rash Trunk: BMI  is elevated . The patient's posture is erect   Neurologic exam : The patient is awake and alert, oriented to place and time.   Memory subjective  described as intact.  Attention span & concentration ability appears normal.  Speech is fluent,  without dysarthria, dysphonia or aphasia.  Mood and affect are appropriate.  Cranial nerves: Pupils are equal and briskly reactive to light. Funduscopic exam without evidence of pallor or edema.  Extraocular movements  in vertical and horizontal planes intact and without nystagmus. Visual fields by finger perimetry are intact. Hearing to finger rub intact.  Facial sensation intact to fine touch. Facial motor strength is symmetric and tongue and uvula move midline. Shoulder shrug was symmetrical.   Motor exam:  Normal tone, muscle bulk and symmetric strength in all extremities.  Sensory:  Fine touch, pinprick and vibration, Proprioception tested in the upper extremities was normal.  Coordination: Rapid alternating movements ,Finger-to-nose maneuver  normal without evidence of ataxia, dysmetria or tremor.  Gait and station: Patient walks without assistive device and is able unassisted to climb up to the exam table. Strength within normal limits.  Stance is stable and normal.  Deep tendon reflexes: in the upper and lower extremities are symmetric and intact. Babinski maneuver response is downgoing.  The patient was advised of the nature of the diagnosed sleep disorder , the treatment options and risks for general a health and wellness arising from not treating the condition.  I spent more than 45 minutes  of face to face time with the patient. Greater than 50% of time was spent in counseling and coordination of care. We have discussed the diagnosis and differential and I answered the patient's questions.     Assessment:  After physical and neurologic examination, review of laboratory studies,  Personal review of imaging studies, reports of other /same  Imaging studies ,  Results of polysomnography/ neurophysiology testing and pre-existing records as far as provided in visit., my assessment is   1) Mrs. Lucretia RoersWood is very likely to still suffer from obstructive sleep apnea and given that her risk factors have not majorly changed since her initial sleep study was performed I suspect that the apnea will be to a severe degree. She does have a high-grade Mallampati she has an elevated body mass index and larger than average neck circumference.  2) I would like to work with Mrs. Wood on some of her sleep hygiene issues especially the TV in the bedroom. It would be most beneficial for her to be asleep before midnight and her latest sleep time should be 11 PM this would allow her for 7 hours of nocturnal sleep before going to work. Sometimes melatonin at a low dose of 3 mg come be taken 30 minutes prior to intended bedtime and will be enough to help establish a longer sleep. At night. If Mrs. Lucretia RoersWood has trouble falling asleep in spite of melatonin use I am not opposed to temporarily use Ambien to [place] or [employer].  3) I will give the patient some insomnia guidelines and invite her for a split night polysomnography. Capnography will be added because of her reported sleep related morning headaches and because of her high risk of obesity hypoventilation. The patient snores.    We will follow-up within 3-6 weeks after the sleep study is performed      Melvyn Novasarmen Akeya Ryther MD  02/02/2016   CC: Dorothyann Pengobyn Sanders, Md [address] [place], [place] [address]  SLEEP [facility]INE CLINIC   Provider:   Melvyn Novas, M D  Referring Provider: Dorothyann Peng, MD Primary Care Physician:  Gwynneth Aliment, MD

## 2016-02-02 NOTE — Patient Instructions (Signed)

## 2016-02-22 ENCOUNTER — Encounter: Payer: Self-pay | Admitting: Family Medicine

## 2016-02-22 ENCOUNTER — Ambulatory Visit (INDEPENDENT_AMBULATORY_CARE_PROVIDER_SITE_OTHER): Payer: BLUE CROSS/BLUE SHIELD | Admitting: Family Medicine

## 2016-02-22 VITALS — BP 128/88 | HR 130 | Temp 98.7°F | Resp 18 | Ht 63.0 in | Wt >= 6400 oz

## 2016-02-22 DIAGNOSIS — S90811A Abrasion, right foot, initial encounter: Secondary | ICD-10-CM | POA: Diagnosis not present

## 2016-02-22 DIAGNOSIS — Z23 Encounter for immunization: Secondary | ICD-10-CM | POA: Diagnosis not present
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
## 2016-02-22 NOTE — Patient Instructions (Addendum)
  IF you received an x-ray today, you will receive an invoice from [facility]. Please contact [facility] at [phone] with questions or concerns regarding your invoice.   IF you received labwork today, you will receive an invoice from [facility]. Please contact [facility] at [phone] with questions or concerns regarding your invoice.   Our billing staff will not be able to assist you with questions regarding bills from these companies.  You will be contacted with the lab results as soon as they are available. The fastest way to get your results is to activate your My Chart account. Instructions are located on the last page of this paperwork. If you have not heard from us regarding the results in 2 weeks, please contact this office.     It was nice meeting you today Ms. Bosque!  You can clean your wounds with regular soap and water. Do not continue to use hydrogen peroxide or any other cleansers. You can also continue to use the antibiotic ointment.   Keep the wounds covered with a bandaid when you are wearing socks/shoes. You can leave the wounds open to the air otherwise, but apply the antibiotic ointment twice a day.   If you start to notice drainage from any of the wounds, or if they become very red or more painful, these could be signs of infection. If you develop fevers, chills, nausea or vomiting, these could be signs of a more serious infection. Please call to schedule another appointment if any of these things occur.   If you have any questions or concerns, please feel free to call the clinic.   Be well,  Dr. Natale MilchLancaster

## 2016-02-22 NOTE — Assessment & Plan Note (Signed)
Present on lateral fifth digit and near lateral malleolus. Tenderness only when walking and rubbing against shoe. Ecchymosis but no bony tenderness suggestive of fracture, so imaging not indicated at this time. No signs of infection, with no surrounding erythema or drainage.  - Clean with soap and water - Continue applying antibiotic cream - Educated patient on signs of infection, but local and systemic, with return precautions given

## 2016-02-23 NOTE — Progress Notes (Signed)
Patient discussed with Dr. Natale MilchLancaster. Agree with her findings and plan below.

## 2016-03-03 ENCOUNTER — Ambulatory Visit (INDEPENDENT_AMBULATORY_CARE_PROVIDER_SITE_OTHER): Payer: BLUE CROSS/BLUE SHIELD | Admitting: Neurology

## 2016-03-03 DIAGNOSIS — F5102 Adjustment insomnia: Secondary | ICD-10-CM

## 2016-03-03 DIAGNOSIS — G4710 Hypersomnia, unspecified: Secondary | ICD-10-CM

## 2016-03-03 DIAGNOSIS — E669 Obesity, unspecified: Secondary | ICD-10-CM

## 2016-03-03 DIAGNOSIS — R0683 Snoring: Secondary | ICD-10-CM

## 2016-03-03 DIAGNOSIS — G4730 Sleep apnea, unspecified: Secondary | ICD-10-CM | POA: Diagnosis not present

## 2016-03-03 DIAGNOSIS — E662 Morbid (severe) obesity with alveolar hypoventilation: Secondary | ICD-10-CM

## 2016-03-10 ENCOUNTER — Telehealth: Payer: Self-pay

## 2016-03-10 DIAGNOSIS — G4733 Obstructive sleep apnea (adult) (pediatric): Secondary | ICD-10-CM

## 2016-03-10 NOTE — Telephone Encounter (Signed)
Order for attended CPAP titration was placed. CD

## 2016-03-10 NOTE — Telephone Encounter (Signed)
I called pt to discuss sleep study results. No answer, left a message asking her to call me back. 

## 2016-03-10 NOTE — Telephone Encounter (Signed)
I spoke to pt. I advised her that her sleep study results revealed moderate sleep apnea, but supine sleep and REM sleep were not recorded. These could have increased the AHI. Pt had prolonged sleep latency. Mild snoring was noted. No evidence of obesity hypoventilation. A prolonged hypoxia time during sleep was not recorded. Dr. Vickey Hugerohmeier recommends a full night, attended, cpap titration study to optimize therapy. Pt is agreeable to this. Pt knows that our sleep lab will call her to schedule this sleep study. Pt verbalized understanding of results. Pt had no questions at this time but was encouraged to call back if questions arise.   Order for cpap titration not placed. Will send to Dr. Vickey Hugerohmeier for review.

## 2016-03-10 NOTE — Telephone Encounter (Signed)
Pt returned RN's call °

## 2016-03-31 ENCOUNTER — Encounter: Payer: Self-pay | Admitting: Internal Medicine

## 2016-04-20 ENCOUNTER — Institutional Professional Consult (permissible substitution): Payer: Self-pay | Admitting: Internal Medicine

## 2016-04-21 ENCOUNTER — Ambulatory Visit (INDEPENDENT_AMBULATORY_CARE_PROVIDER_SITE_OTHER): Payer: BLUE CROSS/BLUE SHIELD | Admitting: Neurology

## 2016-04-21 DIAGNOSIS — G4733 Obstructive sleep apnea (adult) (pediatric): Secondary | ICD-10-CM | POA: Diagnosis not present

## 2016-04-28 ENCOUNTER — Telehealth: Payer: Self-pay | Admitting: Neurology

## 2016-04-28 DIAGNOSIS — E669 Obesity, unspecified: Secondary | ICD-10-CM

## 2016-04-28 DIAGNOSIS — G4733 Obstructive sleep apnea (adult) (pediatric): Secondary | ICD-10-CM

## 2016-04-28 NOTE — Telephone Encounter (Signed)
I spoke to pt and advised her that Dr. Vickey Hugerohmeier reviewed her sleep study and found that she does have severe osa and loud snoring but neither hypercapnia nor hypoxemia were noted. Dr. Vickey Hugerohmeier recommends that pt start a cpap. Pt is agreeable to this. I advised pt that I would send the order for cpap to a DME and they will call her to discuss set up. Will send to [facility]. A follow up appt was made with pt for 07/07/2016 at 1:30pm. Pt verbalized understanding of results. Pt had no questions at this time but was encouraged to call back if questions arise.

## 2016-04-28 NOTE — Telephone Encounter (Signed)
Please call with results. Post-study, the patient indicated that sleep was better on CPAP .  POLYSOMNOGRAPHY IMPRESSION :   1. Obstructive Sleep Apnea (OSA), severe with an AHI of 44 /hr.   2. Loud Snoring 3. Neither hypercapnia, nor clinically significant hypoxemia were documented.   RECOMMENDATIONS:  1. Advise to start auto PAP from 7  through 15 cm water and use AIRFIT P 10  in medium pillow size. Will follow clinical symptomatology.  Advise to add heated humidity.  Adjust interface and heated humidity as needed.     2. Compliance to PAP therapy should be emphasized.  Compliance, AHI and air leak information to be downloaded for objective assessment at 30 days, 180 days and annually thereafter.   3. Further information regarding OSA may be obtained from [employer] (www.sleepfoundation.org) or American Sleep Apnea Association (www.sleepapnea.org). 4. A follow up appointment will be [facility].

## 2016-04-28 NOTE — Telephone Encounter (Signed)
-----   Message from Melvyn Novasarmen Dohmeier, MD sent at 04/28/2016  2:46 PM EST ----- Post-study, the patient indicated that sleep was better on CPAP .  POLYSOMNOGRAPHY IMPRESSION :   1. Obstructive Sleep Apnea (OSA), severe with an AHI of 44 /hr.   2. Loud Snoring 3. Neither hypercapnia, nor clinically significant hypoxemia were documented.   RECOMMENDATIONS:  1. Advise to start auto PAP from 7  through 15 cm water and use AIRFIT P 10  in medium pillow size. Will follow clinical symptomatology.  Advise to add heated humidity.  Adjust interface and heated humidity as needed.     2. Compliance to PAP therapy should be emphasized.  Compliance, AHI and air leak information to be downloaded for objective assessment at 30 days, 180 days and annually thereafter.   3. Further information regarding OSA may be obtained from [employer] (www.sleepfoundation.org) or American Sleep Apnea Association (www.sleepapnea.org). 4. A follow up appointment will be scheduled in the Sleep Clinic at [facility].

## 2016-04-28 NOTE — Procedures (Signed)
PATIENT'S NAME:  Sarah Barton, Sarah Barton DOB:      11-Feb-1981      MR#:    [id number]     DATE OF RECORDING: 04/21/2016 REFERRING M.D.:  Dorothyann Pengobyn Sanders, MD Study Performed:  Split-Night Titration Study HISTORY: Super obese patient with labored breathing, snoring and apnea. This patient underwent a PSG study on 03-03-2016, but was unable to sleep long enough for SPLIT. AHI was 16.5 , no REM sleep seen. Capnography was negative.  The patient endorsed the Epworth Sleepiness Scale at 7 points  The patient's weight 439 pounds with a height of 64 (inches), resulting in a BMI of 74.9 kg/m2.The patient's neck circumference measured 17 inches.  CURRENT MEDICATIONS: Xanax, Eszopicione, Prinzide, Zestoretic, Prilosec, Zoloft, Synthroid    PROCEDURE:  This is a multichannel digital polysomnogram utilizing the Somnostar 11.2 system.  Electrodes and sensors were applied and monitored per AASM Specifications.   EEG, EOG, Chin and Limb EMG, were sampled at 200 Hz.  ECG, Snore and Nasal Pressure, Thermal Airflow, Respiratory Effort, CPAP Flow and Pressure, Oximetry was sampled at 50 Hz. Digital video and audio were recorded.      BASELINE STUDY WITHOUT CPAP RESULTS:  Lights Out was at 23:00 and Lights On at 05:18.  Total recording time (TRT) was 104, with a total sleep time (TST) of 72 minutes.   The patient's sleep latency was 31 minutes.  REM latency was 0 minutes.  The sleep efficiency was 69.2 %.    SLEEP ARCHITECTURE: WASO (Wake after sleep onset) was 0 minutes, Stage N1 was 4 minutes, Stage N2 was 68 minutes, Stage N3 was 0 minutes and Stage R (REM sleep) was 0 minutes.   The percentages were Stage N1 5.6%, Stage N2 94.4%, Stage N3 0% and Stage R (REM sleep) 0%.   RESPIRATORY ANALYSIS:  There were a total of 53 respiratory events:  7 obstructive apneas, 0 central apneas and 0 mixed apneas with a total of 7 apneas and an apnea index (AI) of 5.8. There were 46 hypopneas with a hypopnea index of 38.3. The patient also  had 0 respiratory event related arousals (RERAs).  Snoring was noted.     The total APNEA/HYPOPNEA INDEX (AHI) was 44.2 /hour and the total RESPIRATORY DISTURBANCE INDEX was 44.2 /hour.  0 events occurred in REM sleep and 92 events in NREM. The REM AHI was 0, /hour versus a non-REM AHI of 44.2 /hour. The patient spent 0 minutes sleep time in the supine position 324 minutes in non-supine. The supine AHI was 0.0 /hour versus a non-supine AHI of 44.1 /hour.  OXYGEN SATURATION & C02:  The wake baseline 02 saturation was 98%, with the lowest being 83%. Time spent below 89% saturation equaled 5 minutes.  PERIODIC LIMB MOVEMENTS:    The patient had a total of 0 Periodic Limb Movements Audio and video analysis did not show any abnormal or unusual movements, behaviors, phonations or vocalizations Snoring was noted EKG was in keeping with normal sinus rhythm (NSR)    TITRATION STUDY WITH CPAP RESULTS:   CPAP was initiated at 5 cmH20 with heated humidity per AASM split night standards and pressure was advanced to 15/15 cmH20 because of hypopneas, apneas and desaturations.  At a PAP pressure of 15 cmH20, there was a reduction of the AHI to 0.0 /hour.   Total recording time (TRT) was 274.5 minutes, with a total sleep time (TST) of 252 minutes. The patient's sleep latency was 19.5 minutes. REM latency was 110.5 minutes.  The sleep efficiency was 91.8 %.    SLEEP ARCHITECTURE: Wake after sleep was 3 minutes, Stage N1 13 minutes, Stage N2 203.5 minutes, Stage N3 0 minutes and Stage R (REM sleep) 35.5 minutes. The percentages were: Stage N1 5.2%, Stage N2 80.8%, Stage N3 0% and Stage R (REM sleep) 14.1%.  RESPIRATORY ANALYSIS:  There were a total of 8 respiratory events: 0 apneas and 8 hypopneas with a hypopnea index of 1.9 /hour. The patient also had 0 respiratory event related arousals (RERAs).      The total APNEA/HYPOPNEA INDEX (AHI) was 1.9 /hour and the total RESPIRATORY DISTURBANCE INDEX was 1.9 /hour.   0 events occurred in REM sleep and 8 events in NREM. The REM AHI was 0 /hour versus a non-REM AHI of 2.2 /hour. REM sleep was achieved on a pressure of 10cm/ H2O. The patient spent 0% of total sleep time in the supine position. The supine AHI was 0.0 /hour, versus a non-supine AHI of 1.9/hour.  OXYGEN SATURATION & C02:  The wake baseline 02 saturation was 96%, with the lowest being 90%. Time spent below 89%.  PERIODIC LIMB MOVEMENTS:    The patient had a total of 206 Periodic Limb Movements. The Periodic Limb Movement (PLM) index was 49. /hour and the PLM Arousal index was 0.2 /hour.  Post-study, the patient indicated that sleep was better on CPAP .  POLYSOMNOGRAPHY IMPRESSION :   1. Obstructive Sleep Apnea (OSA), severe with an AHI of 44 /hr.   2. Loud Snoring 3. Neither hypercapnia, nor clinically significant hypoxemia were documented.   RECOMMENDATIONS:  1. Advise to start auto PAP from 7  through 15 cm water and use AIRFIT P 10  in medium pillow size. Will follow clinical symptomatology.  Advise to add heated humidity.  Adjust interface and heated humidity as needed.     2. Compliance to PAP therapy should be emphasized.  Compliance, AHI and air leak information to be downloaded for objective assessment at 30 days, 180 days and annually thereafter.   3. Further information regarding OSA may be obtained from [employer] (www.sleepfoundation.org) or American Sleep Apnea Association (www.sleepapnea.org). 4. A follow up appointment will be scheduled in the Sleep Clinic at [facility].      I certify that I have reviewed the entire raw data recording prior to the issuance of this report in accordance with the Standards of Accreditation of the American Academy of Sleep Medicine (AASM)      Melvyn Novasarmen Ananda Caya, M.D. Diplomat, Biomedical engineerAmerican Board of Psychiatry and Neurology  Diplomat, Biomedical engineerAmerican Board of Sleep Medicine Wellsite geologistMedical Director, [facility]  [employer]

## 2016-05-23 NOTE — Telephone Encounter (Signed)
Received this notification from [facility]: "I just spoke with this patient to schedule and she has advised that for financial reasons she will need to wait to be set up on Cpap around February or March and she will call me when she is ready.  I let her know that I will be notifying you and Dr. Vickey Hugerohmeier so that you can adjust her follow up appointment accordingly."  I called pt. She is agreeable to a 09/12/16 appt at 1:30pm and will call me if she has not started her cpap by 08/12/16. Pt verbalized understanding of appt date and time.

## 2016-07-07 ENCOUNTER — Ambulatory Visit: Payer: Self-pay | Admitting: Neurology

## 2016-07-14 ENCOUNTER — Encounter: Payer: Self-pay | Admitting: Neurology

## 2016-09-12 ENCOUNTER — Ambulatory Visit: Payer: Self-pay | Admitting: Neurology

## 2018-03-08 ENCOUNTER — Other Ambulatory Visit: Payer: Self-pay | Admitting: Internal Medicine

## 2018-05-21 ENCOUNTER — Ambulatory Visit (INDEPENDENT_AMBULATORY_CARE_PROVIDER_SITE_OTHER): Payer: Managed Care, Other (non HMO) | Admitting: Internal Medicine

## 2018-05-21 ENCOUNTER — Encounter: Payer: Self-pay | Admitting: Internal Medicine

## 2018-05-21 VITALS — BP 126/82 | HR 98 | Temp 98.5°F | Ht 63.0 in | Wt >= 6400 oz

## 2018-05-21 DIAGNOSIS — M170 Bilateral primary osteoarthritis of knee: Secondary | ICD-10-CM | POA: Diagnosis not present

## 2018-05-21 DIAGNOSIS — R7309 Other abnormal glucose: Secondary | ICD-10-CM

## 2018-05-21 DIAGNOSIS — Z23 Encounter for immunization: Secondary | ICD-10-CM

## 2018-05-21 DIAGNOSIS — E039 Hypothyroidism, unspecified: Secondary | ICD-10-CM

## 2018-05-21 DIAGNOSIS — Z6841 Body Mass Index (BMI) 40.0 and over, adult: Secondary | ICD-10-CM

## 2018-05-21 MED ORDER — TETANUS-DIPHTH-ACELL PERTUSSIS 5-2.5-18.5 LF-MCG/0.5 IM SUSP
0.5000 mL | Freq: Once | INTRAMUSCULAR | Status: AC
Start: 2018-05-21 — End: 2018-05-21
  Administered 2018-05-21: 0.5 mL via INTRAMUSCULAR

## 2018-05-21 NOTE — Progress Notes (Signed)
Subjective:     Patient ID: Sarah Barton , female    DOB: [birth year] , 38 y.o.   MRN: [id number]   Chief Complaint  Patient presents with  . Hypothyroidism    HPI  Thyroid Problem  Presents for follow-up visit. Patient reports no constipation, depressed mood, diaphoresis, menstrual problem or visual change. The symptoms have been stable.     Past Medical History:  Diagnosis Date  . Depression   . Dysmetabolic syndrome   . Hypertension   . Hypothyroidism   . Morbid obesity ([place])   . Obesity, morbid ([place])   . Sciatica   . Thyroid disease      Family History  Problem Relation Age of Onset  . Hypertension Mother   . Migraines Mother   . Anemia Mother   . Asthma Mother   . Cancer Maternal Grandfather        Colon  . Cancer Paternal Grandmother        Breast  . Diabetes Paternal Uncle   . Kidney disease Paternal Uncle   . Diabetes Paternal Aunt   . Migraines Maternal Aunt   . Sarcoidosis Father   . Gout Father      Current Outpatient Medications:  .  ALPRAZolam (XANAX) 0.25 MG tablet, Take 0.25 mg by mouth 2 (two) times daily as needed., Disp: , Rfl: 0 .  lisinopril-hydrochlorothiazide (PRINZIDE,ZESTORETIC) 10-12.5 MG per tablet, Take 1 tablet by mouth daily., Disp: , Rfl:  .  sertraline (ZOLOFT) 100 MG tablet, Take 100 mg by mouth daily., Disp: , Rfl:  .  SYNTHROID 137 MCG tablet, TAKE 1 TABLET BY ORAL ROUTE MON-SUN, Disp: 90 tablet, Rfl: 0 .  omeprazole (PRILOSEC) 20 MG capsule, Take 20 mg by mouth daily., Disp: , Rfl:  .  Vitamin D, Ergocalciferol, (DRISDOL) 50000 UNITS CAPS, Take 50,000 Units by mouth 2 (two) times a week., Disp: , Rfl:   Current Facility-Administered Medications:  .  Tdap (BOOSTRIX) injection 0.5 mL, 0.5 mL, Intramuscular, Once, Glendale Chard, MD   No Known Allergies   Review of Systems  Constitutional: Negative.  Negative for diaphoresis.  Respiratory: Negative.   Cardiovascular: Negative.   Gastrointestinal: Negative.  Negative for  constipation.  Genitourinary: Negative for menstrual problem.  Musculoskeletal: Positive for arthralgias (she has b/l knee pain. now followed by ortho. ).  Neurological: Negative.   Psychiatric/Behavioral: Negative.      Today's Vitals   05/21/18 1559  BP: 126/82  Pulse: 98  Temp: 98.5 F (36.9 C)  TempSrc: Oral  Weight: (!) 436 lb 6.4 oz (197.9 kg)  Height: _0  (1.6 m)   Body mass index is 77.3 kg/m.   Objective:  Physical Exam Vitals signs and nursing note reviewed.  Constitutional:      Appearance: Normal appearance. She is obese.  HENT:     Head: Normocephalic and atraumatic.     Nose: Nose normal.  Cardiovascular:     Rate and Rhythm: Normal rate and regular rhythm.     Heart sounds: Normal heart sounds.  Pulmonary:     Effort: Pulmonary effort is normal.     Breath sounds: Normal breath sounds.  Skin:    General: Skin is warm.  Neurological:     General: No focal deficit present.     Mental Status: She is alert.  Psychiatric:        Mood and Affect: Mood normal.         Assessment And Plan:     1.  Primary hypothyroidism  I will check a thyroid panel and adjust meds as needed.  - TSH - T4, Free  2. Primary osteoarthritis of both knees  Chronic. She is now followed by orthopedics. She reports she was advised that she will need surgery in the near future.   3. Other abnormal glucose  HER A1C HAS BEEN ELEVATED IN THE PAST. I WILL CHECK AN A1C, BMET TODAY. SHE WAS ENCOURAGED TO AVOID SUGARY BEVERAGES AND PROCESSED FOODS INCLUDNG BREADS, RICE AND PASTA.  - Hemoglobin A1c - BMP8+EGFR  4. Class 3 severe obesity due to excess calories with serious comorbidity and body mass index (BMI) greater than or equal to 70 in adult [facility])  She does not wish to pursue surgical option at this time. She plans to start an herbalife program. She does not wish to start medical weight loss program at this time. She is encouraged to strive for BMI less than 60 to decrease  cardiac risk. She is encouraged to exercise 30 minutes five days weekly.   5. Need for vaccination  - Tdap (BOOSTRIX) injection 0.5 mL      Maximino Greenland, MD

## 2018-05-22 ENCOUNTER — Encounter: Payer: Self-pay | Admitting: Internal Medicine

## 2018-05-22 LAB — BMP8+EGFR
BUN/Creatinine Ratio: 14 (ref 9–23)
BUN: 12 mg/dL (ref 6–20)
CO2: 23 mmol/L (ref 20–29)
Calcium: 9.8 mg/dL (ref 8.7–10.2)
Chloride: 103 mmol/L (ref 96–106)
Creatinine, Ser: 0.87 mg/dL (ref 0.57–1.00)
GFR calc Af Amer: 97 mL/min/{1.73_m2} (ref >59)
GFR calc non Af Amer: 84 mL/min/{1.73_m2} (ref >59)
Glucose: 94 mg/dL (ref 65–99)
Potassium: 4.5 mmol/L (ref 3.5–5.2)
Sodium: 140 mmol/L (ref 134–144)

## 2018-05-22 LAB — TSH: TSH: 6.12 u[IU]/mL — ABNORMAL HIGH (ref 0.450–4.500)

## 2018-05-22 LAB — T4, FREE: Free T4: 1.51 ng/dL (ref 0.82–1.77)

## 2018-05-22 LAB — HEMOGLOBIN A1C
Est. average glucose Bld gHb Est-mCnc: 120 mg/dL
HEMOGLOBIN A1C: 5.8 % — AB (ref 4.8–5.6)

## 2018-05-22 NOTE — Progress Notes (Signed)
Here are your lab results:   Your TSH is elevated, have you been taking your thyroid meds regularly? Your hba1c is 5.8, this is in prediabetes range. Your kidney function is normal.   Please respond ASAP so I can make proper recommendations.   Sincerely,    Rhilee Currin N. Allyne Gee, MD

## 2018-06-13 ENCOUNTER — Other Ambulatory Visit: Payer: Self-pay | Admitting: Internal Medicine

## 2018-07-01 ENCOUNTER — Other Ambulatory Visit: Payer: Self-pay | Admitting: Internal Medicine

## 2018-08-27 ENCOUNTER — Ambulatory Visit: Payer: 59 | Admitting: Internal Medicine

## 2018-08-28 ENCOUNTER — Other Ambulatory Visit: Payer: Self-pay

## 2018-08-28 ENCOUNTER — Encounter: Payer: Self-pay | Admitting: Internal Medicine

## 2018-08-28 ENCOUNTER — Ambulatory Visit (INDEPENDENT_AMBULATORY_CARE_PROVIDER_SITE_OTHER): Payer: Managed Care, Other (non HMO) | Admitting: Internal Medicine

## 2018-08-28 VITALS — BP 136/102 | HR 97 | Temp 97.9°F | Ht 63.0 in | Wt >= 6400 oz

## 2018-08-28 DIAGNOSIS — I10 Essential (primary) hypertension: Secondary | ICD-10-CM | POA: Diagnosis not present

## 2018-08-28 DIAGNOSIS — E039 Hypothyroidism, unspecified: Secondary | ICD-10-CM

## 2018-08-28 DIAGNOSIS — R7309 Other abnormal glucose: Secondary | ICD-10-CM

## 2018-08-28 DIAGNOSIS — E559 Vitamin D deficiency, unspecified: Secondary | ICD-10-CM | POA: Diagnosis not present

## 2018-08-28 MED ORDER — LISINOPRIL-HYDROCHLOROTHIAZIDE 10-12.5 MG PO TABS: 1.0000 | ORAL_TABLET | Freq: Every day | ORAL | 1 refills | Status: DC

## 2018-08-28 NOTE — Patient Instructions (Signed)

## 2018-08-29 ENCOUNTER — Other Ambulatory Visit: Payer: Self-pay

## 2018-08-29 ENCOUNTER — Encounter: Payer: Self-pay | Admitting: Internal Medicine

## 2018-08-29 LAB — VITAMIN D 25 HYDROXY (VIT D DEFICIENCY, FRACTURES): Vit D, 25-Hydroxy: 18.7 ng/mL — ABNORMAL LOW (ref 30.0–100.0)

## 2018-08-29 LAB — TSH: TSH: 4.56 u[IU]/mL — ABNORMAL HIGH (ref 0.450–4.500)

## 2018-08-29 LAB — HEMOGLOBIN A1C
Est. average glucose Bld gHb Est-mCnc: 114 mg/dL
Hgb A1c MFr Bld: 5.6 % (ref 4.8–5.6)

## 2018-08-29 MED ORDER — LEVOTHYROXINE SODIUM 137 MCG PO TABS: ORAL_TABLET | ORAL | 0 refills | Status: DC

## 2018-09-09 NOTE — Progress Notes (Signed)
Subjective:     Patient ID: Sarah Barton , female    DOB: [birth year] , 38 y.o.   MRN: [id number]   Chief Complaint  Patient presents with  . Hypothyroidism  . Hypertension    HPI  She is here today for a thyroid check. Unfortunately, she did not start the new regimen as discussed after her last visit. She does report compliance with meds.   Hypertension  This is a chronic problem. The current episode started more than 1 year ago. The problem has been gradually improving since onset. The problem is uncontrolled. Pertinent negatives include no blurred vision, chest pain, palpitations or shortness of breath. Risk factors for coronary artery disease include sedentary lifestyle, obesity and stress. Past treatments include ACE inhibitors and diuretics. Identifiable causes of hypertension include a thyroid problem.     Past Medical History:  Diagnosis Date  . Depression   . Dysmetabolic syndrome   . Hypertension   . Hypothyroidism   . Morbid obesity ([facility])   . Obesity, morbid ([facility])   . Sciatica   . Thyroid disease      Family History  Problem Relation Age of Onset  . Hypertension Mother   . Migraines Mother   . Anemia Mother   . Asthma Mother   . Cancer Maternal Grandfather        Colon  . Cancer Paternal Grandmother        Breast  . Diabetes Paternal Uncle   . Kidney disease Paternal Uncle   . Diabetes Paternal Aunt   . Migraines Maternal Aunt   . Sarcoidosis Father   . Gout Father      Current Outpatient Medications:  .  ALPRAZolam (XANAX) 1 MG tablet, , Disp: , Rfl:  .  lisinopril-hydrochlorothiazide (PRINZIDE,ZESTORETIC) 10-12.5 MG tablet, Take 1 tablet by mouth daily., Disp: 90 tablet, Rfl: 1 .  omeprazole (PRILOSEC) 20 MG capsule, Take 20 mg by mouth daily., Disp: , Rfl:  .  sertraline (ZOLOFT) 100 MG tablet, Take 100 mg by mouth daily., Disp: , Rfl:  .  levothyroxine (SYNTHROID) 137 MCG tablet, Take 1 tablet by mouth Monday - Friday, Disp: 90 tablet, Rfl: 0 .   SYNTHROID 150 MCG tablet, Take 150 mcg by mouth daily before breakfast. Take 1 tablet by mouth Saturday - Sunday, Disp: , Rfl:  .  Vitamin D, Ergocalciferol, (DRISDOL) 50000 UNITS CAPS, Take 50,000 Units by mouth 2 (two) times a week., Disp: , Rfl:    No Known Allergies   Review of Systems  Constitutional: Negative.   Eyes: Negative for blurred vision.  Respiratory: Negative.  Negative for shortness of breath.   Cardiovascular: Negative.  Negative for chest pain and palpitations.  Gastrointestinal: Negative.   Neurological: Negative.   Psychiatric/Behavioral: Negative.      Today's Vitals   08/28/18 1202  BP: (!) 136/102  Pulse: 97  Temp: 97.9 F (36.6 C)  TempSrc: Oral  Weight: (!) 443 lb 3.2 oz (201 kg)  Height: 5\' 3"  (1.6 m)   Body mass index is 78.51 kg/m.   Objective:  Physical Exam Vitals signs and nursing note reviewed.  Constitutional:      Appearance: Normal appearance.  HENT:     Head: Normocephalic and atraumatic.  Cardiovascular:     Rate and Rhythm: Normal rate and regular rhythm.     Heart sounds: Normal heart sounds.  Pulmonary:     Effort: Pulmonary effort is normal.     Breath sounds: Normal breath sounds.  Skin:    General: Skin is warm.  Neurological:     General: No focal deficit present.     Mental Status: She is alert.  Psychiatric:        Mood and Affect: Mood normal.        Behavior: Behavior normal.         Assessment And Plan:     1. Primary hypothyroidism  I will check thyroid panel and adjust meds as needed.  - TSH  2. Essential hypertension, benign  Uncontrolled. She will continue with current meds. She is encouraged to avoid adding salt to her foods.   3. Other abnormal glucose  HER A1C HAS BEEN ELEVATED IN THE PAST. I WILL CHECK AN A1C, BMET TODAY. SHE WAS ENCOURAGED TO AVOID SUGARY BEVERAGES AND PROCESSED FOODS INCLUDNG BREADS, RICE AND PASTA.  - Hemoglobin A1c  4. Vitamin D insufficiency  I WILL CHECK A VIT D  LEVEL AND SUPPLEMENT AS NEEDED.  ALSO ENCOURAGED TO SPEND 15 MINUTES IN THE SUN DAILY.  - Vitamin D (25 hydroxy)        Gwynneth Alimentobyn N Astha Probasco, MD    THE PATIENT IS ENCOURAGED TO PRACTICE SOCIAL DISTANCING DUE TO THE COVID-19 PANDEMIC.

## 2018-11-04 ENCOUNTER — Other Ambulatory Visit: Payer: Self-pay | Admitting: Internal Medicine

## 2018-12-17 ENCOUNTER — Encounter: Payer: BLUE CROSS/BLUE SHIELD | Admitting: Internal Medicine

## 2019-01-07 ENCOUNTER — Other Ambulatory Visit: Payer: Self-pay

## 2019-01-07 ENCOUNTER — Ambulatory Visit (INDEPENDENT_AMBULATORY_CARE_PROVIDER_SITE_OTHER): Payer: Managed Care, Other (non HMO) | Admitting: Internal Medicine

## 2019-01-07 ENCOUNTER — Encounter: Payer: Self-pay | Admitting: Internal Medicine

## 2019-01-07 VITALS — BP 146/82 | HR 108 | Temp 98.9°F | Ht 63.0 in | Wt >= 6400 oz

## 2019-01-07 DIAGNOSIS — Z6841 Body Mass Index (BMI) 40.0 and over, adult: Secondary | ICD-10-CM

## 2019-01-07 DIAGNOSIS — E039 Hypothyroidism, unspecified: Secondary | ICD-10-CM

## 2019-01-07 DIAGNOSIS — L500 Allergic urticaria: Secondary | ICD-10-CM | POA: Diagnosis not present

## 2019-01-07 DIAGNOSIS — I10 Essential (primary) hypertension: Secondary | ICD-10-CM | POA: Diagnosis not present

## 2019-01-07 DIAGNOSIS — T783XXD Angioneurotic edema, subsequent encounter: Secondary | ICD-10-CM | POA: Diagnosis not present

## 2019-01-07 MED ORDER — EPINEPHRINE 0.15 MG/0.3ML IJ SOAJ: 0.1500 mg | INTRAMUSCULAR | 1 refills | Status: DC | PRN

## 2019-01-08 ENCOUNTER — Other Ambulatory Visit: Payer: Self-pay

## 2019-01-08 MED ORDER — EPINEPHRINE 0.3 MG/0.3ML IJ SOAJ: 0.3000 mg | INTRAMUSCULAR | 3 refills | Status: AC | PRN

## 2019-01-08 NOTE — Progress Notes (Signed)
Subjective:     Patient ID: Sarah Barton , female    DOB: [birth year] , 38 y.o.   MRN: [id number]   Chief Complaint  Patient presents with  . Allergic Reaction    HPI  She is here today for further evaluation of an allergic reaction. She reports she went to [employer] on Saturday and ate Asian salad later that day she developed lip and tongue swelling.  She used Teledoc and was prescribed oral steroids.  She awakened the next day with persistent top lip swelling.  She then went to Urgent Care and was given Benadryl to take.    Allergic Reaction This is a new problem. The current episode started 3 to 5 days ago. The problem has been gradually improving since onset. The problem is severe. It is unknown what she was exposed to. The time of exposure was just prior to onset. Associated symptoms include itching. Pertinent negatives include no chest pain, chest pressure or difficulty breathing. Swelling is present on the lips and throat. Past treatments include diphenhydramine, oral corticosteroid and rest. The treatment provided moderate relief. There is no history of asthma or food allergies.     Past Medical History:  Diagnosis Date  . Depression   . Dysmetabolic syndrome   . Hypertension   . Hypothyroidism   . Morbid obesity ([place])   . Obesity, morbid ([place])   . Sciatica   . Thyroid disease      Family History  Problem Relation Age of Onset  . Hypertension Mother   . Migraines Mother   . Anemia Mother   . Asthma Mother   . Cancer Maternal Grandfather        Colon  . Cancer Paternal Grandmother        Breast  . Diabetes Paternal Uncle   . Kidney disease Paternal Uncle   . Diabetes Paternal Aunt   . Migraines Maternal Aunt   . Sarcoidosis Father   . Gout Father      Current Outpatient Medications:  .  ALPRAZolam (XANAX) 1 MG tablet, , Disp: , Rfl:  .  lisinopril-hydrochlorothiazide (PRINZIDE,ZESTORETIC) 10-12.5 MG tablet, Take 1 tablet by mouth daily., Disp: 90 tablet, Rfl:  1 .  omeprazole (PRILOSEC) 20 MG capsule, Take 20 mg by mouth daily., Disp: , Rfl:  .  sertraline (ZOLOFT) 100 MG tablet, Take 100 mg by mouth daily., Disp: , Rfl:  .  SYNTHROID 137 MCG tablet, TAKE 1 TABLET BY ORAL ROUTE MON-SUN, Disp: 90 tablet, Rfl: 0 .  EPINEPHrine 0.3 mg/0.3 mL IJ SOAJ injection, Inject 0.3 mLs (0.3 mg total) into the muscle as needed for anaphylaxis., Disp: 2 each, Rfl: 3 .  SYNTHROID 150 MCG tablet, Take 150 mcg by mouth daily before breakfast. Take 1 tablet by mouth Saturday - Sunday, Disp: , Rfl:  .  Vitamin D, Ergocalciferol, (DRISDOL) 50000 UNITS CAPS, Take 50,000 Units by mouth 2 (two) times a week., Disp: , Rfl:    No Known Allergies   Review of Systems  Constitutional: Negative.   Respiratory: Negative.   Cardiovascular: Negative.  Negative for chest pain.  Gastrointestinal: Negative.   Skin: Positive for itching.  Allergic/Immunologic: Negative for food allergies.  Neurological: Negative.   Psychiatric/Behavioral: Negative.      Today's Vitals   01/07/19 1455  BP: (!) 146/82  Pulse: (!) 108  Temp: 98.9 F (37.2 C)  TempSrc: Oral  Weight: (!) 447 lb 9.6 oz (203 kg)  Height: 5\' 3"  (1.6 m)   Body  mass index is 79.29 kg/m.   Objective:  Physical Exam Vitals signs and nursing note reviewed.  Constitutional:      Appearance: Normal appearance.  HENT:     Head: Normocephalic and atraumatic.  Cardiovascular:     Rate and Rhythm: Normal rate and regular rhythm.     Heart sounds: Normal heart sounds.  Pulmonary:     Effort: Pulmonary effort is normal.     Breath sounds: Normal breath sounds.  Skin:    General: Skin is warm.  Neurological:     General: No focal deficit present.     Mental Status: She is alert.  Psychiatric:        Mood and Affect: Mood normal.        Behavior: Behavior normal.         Assessment And Plan:     1. Urticaria due to food allergy  I will check food allergy profile. She was also given rx Epi-pen to use  prn. She is advised to take levocetirizine to take nightly.   - Food Allergy Profile  2. Angioedema, subsequent encounter  Please see above.   - Food Allergy Profile  3. Essential hypertension, benign  Chronic, fair control. She will continue with current meds for now. She is encouraged to avoid adding salt to her foods.   4. Primary hypothyroidism  Chronic. Importance of medication compliance was discussed with the patient.   5. Class 3 severe obesity due to excess calories with serious comorbidity and body mass index (BMI) greater than or equal to 70 in adult [facility](HCC)  Importance of achieving optimal weight to decrease risk of cardiovascular disease and cancers was discussed with the patient in full detail. Importance of regular exercise was discussed with the patient.  She is encouraged to start slowly - start with 10 minutes twice daily at least three to four days per week and to gradually build to 30 minutes five days weekly. She was given tips to incorporate more activity into her daily routine - take stairs when possible, park farther away from her job, grocery stores, etc.    Gwynneth Alimentobyn N Nidya Bouyer, MD    THE PATIENT IS ENCOURAGED TO PRACTICE SOCIAL DISTANCING DUE TO THE COVID-19 PANDEMIC.

## 2019-01-09 ENCOUNTER — Encounter: Payer: Self-pay | Admitting: Internal Medicine

## 2019-01-09 LAB — FOOD ALLERGY PROFILE
Allergen Corn, IgE: <0.1 kU/L
Clam IgE: <0.1 kU/L
Codfish IgE: <0.1 kU/L
Egg White IgE: <0.1 kU/L
Milk IgE: <0.1 kU/L
Peanut IgE: <0.1 kU/L
Scallop IgE: <0.1 kU/L
Sesame Seed IgE: <0.1 kU/L
Shrimp IgE: <0.1 kU/L
Soybean IgE: <0.1 kU/L
Walnut IgE: <0.1 kU/L
Wheat IgE: <0.1 kU/L

## 2019-01-10 ENCOUNTER — Other Ambulatory Visit: Payer: Self-pay | Admitting: Internal Medicine

## 2019-01-10 DIAGNOSIS — T783XXD Angioneurotic edema, subsequent encounter: Secondary | ICD-10-CM

## 2019-01-10 DIAGNOSIS — L500 Allergic urticaria: Secondary | ICD-10-CM

## 2019-01-17 ENCOUNTER — Encounter: Payer: BLUE CROSS/BLUE SHIELD | Admitting: Internal Medicine

## 2019-01-31 ENCOUNTER — Ambulatory Visit: Payer: Managed Care, Other (non HMO) | Admitting: Allergy & Immunology

## 2019-02-04 ENCOUNTER — Ambulatory Visit (INDEPENDENT_AMBULATORY_CARE_PROVIDER_SITE_OTHER): Payer: Managed Care, Other (non HMO) | Admitting: Internal Medicine

## 2019-02-04 ENCOUNTER — Other Ambulatory Visit: Payer: Self-pay

## 2019-02-04 ENCOUNTER — Encounter: Payer: Managed Care, Other (non HMO) | Admitting: Internal Medicine

## 2019-02-04 ENCOUNTER — Encounter: Payer: Self-pay | Admitting: Internal Medicine

## 2019-02-04 VITALS — Temp 96.9°F | Wt >= 6400 oz

## 2019-02-04 DIAGNOSIS — R0602 Shortness of breath: Secondary | ICD-10-CM | POA: Diagnosis not present

## 2019-02-04 DIAGNOSIS — R059 Cough, unspecified: Secondary | ICD-10-CM

## 2019-02-04 DIAGNOSIS — R197 Diarrhea, unspecified: Secondary | ICD-10-CM | POA: Diagnosis not present

## 2019-02-04 DIAGNOSIS — R05 Cough: Secondary | ICD-10-CM

## 2019-02-04 DIAGNOSIS — R0982 Postnasal drip: Secondary | ICD-10-CM

## 2019-02-04 DIAGNOSIS — Z20822 Contact with and (suspected) exposure to covid-19: Secondary | ICD-10-CM

## 2019-02-04 DIAGNOSIS — Z20828 Contact with and (suspected) exposure to other viral communicable diseases: Secondary | ICD-10-CM

## 2019-02-04 DIAGNOSIS — R509 Fever, unspecified: Secondary | ICD-10-CM | POA: Diagnosis not present

## 2019-02-04 DIAGNOSIS — E559 Vitamin D deficiency, unspecified: Secondary | ICD-10-CM

## 2019-02-04 MED ORDER — VITAMIN D (ERGOCALCIFEROL) 1.25 MG (50000 UNIT) PO CAPS: ORAL_CAPSULE | ORAL | 2 refills | Status: DC

## 2019-02-04 MED ORDER — ALBUTEROL SULFATE HFA 108 (90 BASE) MCG/ACT IN AERS: 2.0000 | INHALATION_SPRAY | Freq: Four times a day (QID) | RESPIRATORY_TRACT | 1 refills | Status: DC | PRN

## 2019-02-04 MED ORDER — AZITHROMYCIN 250 MG PO TABS: ORAL_TABLET | ORAL | 0 refills | Status: AC

## 2019-02-04 NOTE — Patient Instructions (Signed)
COVID-19 Frequently Asked Questions °COVID-19 (coronavirus disease) is an infection that is caused by a large family of viruses. Some viruses cause illness in people and others cause illness in animals like camels, cats, and bats. In some cases, the viruses that cause illness in animals can spread to humans. °Where did the coronavirus come from? °In December 2019, [place] told the World Health Organization (WHO) of several cases of lung disease (human respiratory illness). These cases were linked to an open seafood and livestock market in the city of [place]. The link to the seafood and livestock market suggests that the virus may have spread from animals to humans. However, since that first outbreak in December, the virus has also been shown to spread from person to person. °What is the name of the disease and the virus? °Disease name °Early on, this disease was called novel coronavirus. This is because scientists determined that the disease was caused by a new (novel) respiratory virus. The World Health Organization (WHO) has now named the disease COVID-19, or coronavirus disease. °Virus name °The virus that causes the disease is called severe acute respiratory syndrome coronavirus 2 (SARS-CoV-2). °More information on disease and virus naming °World Health Organization (WHO): www.who.int/emergencies/diseases/novel-coronavirus-2019/technical-guidance/naming-the-coronavirus-disease-(covid-2019)-and-the-virus-that-causes-it °Who is at risk for complications from coronavirus disease? °Some people may be at higher risk for complications from coronavirus disease. This includes older adults and people who have chronic diseases, such as heart disease, diabetes, and lung disease. °If you are at higher risk for complications, take these extra precautions: °· Avoid close contact with people who are sick or have a fever or cough. Stay at least 3-6 ft (1-2 m) away from them, if possible. °· Wash your hands often with soap and  water for at least 20 seconds. °· Avoid touching your face, mouth, nose, or eyes. °· Keep supplies on hand at home, such as food, medicine, and cleaning supplies. °· Stay home as much as possible. °· Avoid social gatherings and travel. °How does coronavirus disease spread? °The virus that causes coronavirus disease spreads easily from person to person (is contagious). There are also cases of community-spread disease. This means the disease has spread to: °· People who have no known contact with other infected people. °· People who have not traveled to areas where there are known cases. °It appears to spread from one person to another through droplets from coughing or sneezing. °Can I get the virus from touching surfaces or objects? °There is still a lot that we do not know about the virus that causes coronavirus disease. Scientists are basing a lot of information on what they know about similar viruses, such as: °· Viruses cannot generally survive on surfaces for long. They need a human body (host) to survive. °· It is more likely that the virus is spread by close contact with people who are sick (direct contact), such as through: °? Shaking hands or hugging. °? Breathing in respiratory droplets that travel through the air. This can happen when an infected person coughs or sneezes on or near other people. °· It is less likely that the virus is spread when a person touches a surface or object that has the virus on it (indirect contact). The virus may be able to enter the body if the person touches a surface or object and then touches his or her face, eyes, nose, or mouth. °Can a person spread the virus without having symptoms of the disease? °It may be possible for the virus to spread before a person   has symptoms of the disease, but this is most likely not the main way the virus is spreading. It is more likely for the virus to spread by being in close contact with people who are sick and breathing in the respiratory  droplets of a sick person's cough or sneeze. °What are the symptoms of coronavirus disease? °Symptoms vary from person to person and can range from mild to severe. Symptoms may include: °· Fever. °· Cough. °· Tiredness, weakness, or fatigue. °· Fast breathing or feeling short of breath. °These symptoms can appear anywhere from 2 to 14 days after you have been exposed to the virus. If you develop symptoms, call your health care provider. People with severe symptoms may need hospital care. °If I am exposed to the virus, how long does it take before symptoms start? °Symptoms of coronavirus disease may appear anywhere from 2 to 14 days after a person has been exposed to the virus. If you develop symptoms, call your health care provider. °Should I be tested for this virus? °Your health care provider will decide whether to test you based on your symptoms, history of exposure, and your risk factors. °How does a health care provider test for this virus? °Health care providers will collect samples to send for testing. Samples may include: °· Taking a swab of fluid from the nose. °· Taking fluid from the lungs by having you cough up mucus (sputum) into a sterile cup. °· Taking a blood sample. °· Taking a stool or urine sample. °Is there a treatment or vaccine for this virus? °Currently, there is no vaccine to prevent coronavirus disease. Also, there are no medicines like antibiotics or antivirals to treat the virus. A person who becomes sick is given supportive care, which means rest and fluids. A person may also relieve his or her symptoms by using over-the-counter medicines that treat sneezing, coughing, and runny nose. These are the same medicines that a person takes for the common cold. °If you develop symptoms, call your health care provider. People with severe symptoms may need hospital care. °What can I do to protect myself and my family from this virus? ° °  ° °You can protect yourself and your family by taking the  same actions that you would take to prevent the spread of other viruses. Take the following actions: °· Wash your hands often with soap and water for at least 20 seconds. If soap and water are not available, use alcohol-based hand sanitizer. °· Avoid touching your face, mouth, nose, or eyes. °· Cough or sneeze into a tissue, sleeve, or elbow. Do not cough or sneeze into your hand or the air. °? If you cough or sneeze into a tissue, throw it away immediately and wash your hands. °· Disinfect objects and surfaces that you frequently touch every day. °· Avoid close contact with people who are sick or have a fever or cough. Stay at least 3-6 ft (1-2 m) away from them, if possible. °· Stay home if you are sick, except to get medical care. Call your health care provider before you get medical care. °· Make sure your vaccines are up to date. Ask your health care provider what vaccines you need. °What should I do if I need to travel? °Follow travel recommendations from your local health authority, the CDC, and WHO. °Travel information and advice °· Centers for Disease Control and Prevention (CDC): www.cdc.gov/coronavirus/2019-ncov/travelers/index.html °· World Health Organization (WHO): www.who.int/emergencies/diseases/novel-coronavirus-2019/travel-advice °Know the risks and take action to protect your health °·   You are at higher risk of getting coronavirus disease if you are traveling to areas with an outbreak or if you are exposed to travelers from areas with an outbreak. °· Wash your hands often and practice good hygiene to lower the risk of catching or spreading the virus. °What should I do if I am sick? °General instructions to stop the spread of infection °· Wash your hands often with soap and water for at least 20 seconds. If soap and water are not available, use alcohol-based hand sanitizer. °· Cough or sneeze into a tissue, sleeve, or elbow. Do not cough or sneeze into your hand or the air. °· If you cough or  sneeze into a tissue, throw it away immediately and wash your hands. °· Stay home unless you must get medical care. Call your health care provider or local health authority before you get medical care. °· Avoid public areas. Do not take public transportation, if possible. °· If you can, wear a mask if you must go out of the house or if you are in close contact with someone who is not sick. °Keep your home clean °· Disinfect objects and surfaces that are frequently touched every day. This may include: °? Counters and tables. °? Doorknobs and light switches. °? Sinks and faucets. °? Electronics such as phones, remote controls, keyboards, computers, and tablets. °· Wash dishes in hot, soapy water or use a dishwasher. Air-dry your dishes. °· Wash laundry in hot water. °Prevent infecting other household members °· Let healthy household members care for children and pets, if possible. If you have to care for children or pets, wash your hands often and wear a mask. °· Sleep in a different bedroom or bed, if possible. °· Do not share personal items, such as razors, toothbrushes, deodorant, combs, brushes, towels, and washcloths. °Where to find more information °Centers for Disease Control and Prevention (CDC) °· Information and news updates: www.cdc.gov/coronavirus/2019-ncov °World Health Organization (WHO) °· Information and news updates: www.who.int/emergencies/diseases/novel-coronavirus-2019 °· Coronavirus health topic: www.who.int/health-topics/coronavirus °· Questions and answers on COVID-19: www.who.int/news-room/q-a-detail/q-a-coronaviruses °· Global tracker: who.sprinklr.com °American Academy of Pediatrics (AAP) °· Information for families: www.healthychildren.org/English/health-issues/conditions/chest-lungs/Pages/2019-Novel-Coronavirus.aspx °The coronavirus situation is changing rapidly. Check your local health authority website or the CDC and WHO websites for updates and news. °When should I contact a health care  provider? °· Contact your health care provider if you have symptoms of an infection, such as fever or cough, and you: °? Have been near anyone who is known to have coronavirus disease. °? Have come into contact with a person who is suspected to have coronavirus disease. °? Have traveled outside of the country. °When should I get emergency medical care? °· Get help right away by calling your local emergency services (911 in the [place] if you have: °? Trouble breathing. °? Pain or pressure in your chest. °? Confusion. °? Blue-tinged lips and fingernails. °? Difficulty waking from sleep. °? Symptoms that get worse. °Let the emergency medical personnel know if you think you have coronavirus disease. °Summary °· A new respiratory virus is spreading from person to person and causing COVID-19 (coronavirus disease). °· The virus that causes COVID-19 appears to spread easily. It spreads from one person to another through droplets from coughing or sneezing. °· Older adults and those with chronic diseases are at higher risk of disease. If you are at higher risk for complications, take extra precautions. °· There is currently no vaccine to prevent coronavirus disease. There are no medicines, such as antibiotics or   antivirals, to treat the virus. °· You can protect yourself and your family by washing your hands often, avoiding touching your face, and covering your coughs and sneezes. °This information is not intended to replace advice given to you by your health care provider. Make sure you discuss any questions you have with your health care provider. °Document Released: 08/28/2018 Document Revised: 08/28/2018 Document Reviewed: 08/28/2018 °Elsevier Patient Education © 2020 Elsevier Inc. ° °

## 2019-02-04 NOTE — Progress Notes (Signed)
Virtual Visit via Video   This visit type was conducted due to national recommendations for restrictions regarding the COVID-19 Pandemic (e.g. social distancing) in an effort to limit this patient's exposure and mitigate transmission in our community.  Due to her co-morbid illnesses, this patient is at least at moderate risk for complications without adequate follow up.  This format is felt to be most appropriate for this patient at this time.  All issues noted in this document were discussed and addressed.  A limited physical exam was performed with this format.    This visit type was conducted due to national recommendations for restrictions regarding the COVID-19 Pandemic (e.g. social distancing) in an effort to limit this patient's exposure and mitigate transmission in our community.  Patients identity confirmed using two different identifiers.  This format is felt to be most appropriate for this patient at this time.  All issues noted in this document were discussed and addressed.  No physical exam was performed (except for noted visual exam findings with Video Visits).    Date:  02/04/2019   ID:  Sarah Barton, DOB [birth year], MRN [id number]  Patient Location:  Home  Provider location:   Office    Chief Complaint:  "I have a cough/diarrhea"  History of Present Illness:    Sarah Barton is a 38 y.o. female who presents via video conferencing for a telehealth visit today.    The patient does have symptoms concerning for COVID-19 infection (fever, chills, cough, or new shortness of breath).   She presents today for virtual visit. She prefers this method of contact due to COVID-19 pandemic.  She presents today for further evaluation of cough and diarrhea. She reports she developed a cough last Monday. She adds that she had a temp of 100.0.  Her symptoms worsened later in the week, and she developed diarrhea. She had Tele-doc appointment on Saturday, she was advised to contact PCP for  COVID testing. She reports worsening of SOB with exertion. She denies SOB at rest. She denies eating at any new restaurants and admits she rarely leaves her home. She is not aware of any known exposure.   Cough This is a new problem. The current episode started 1 to 4 weeks ago. The problem has been gradually worsening. The cough is productive of purulent sputum. Associated symptoms include a fever, postnasal drip and shortness of breath.     Past Medical History:  Diagnosis Date  . Depression   . Dysmetabolic syndrome   . Hypertension   . Hypothyroidism   . Morbid obesity ([facility])   . Obesity, morbid ([facility])   . Sciatica   . Thyroid disease    Past Surgical History:  Procedure Laterality Date  . WISDOM TOOTH EXTRACTION       Current Meds  Medication Sig  . ALPRAZolam (XANAX) 1 MG tablet   . EPINEPHrine 0.3 mg/0.3 mL IJ SOAJ injection Inject 0.3 mLs (0.3 mg total) into the muscle as needed for anaphylaxis.  Marland Kitchen lisinopril-hydrochlorothiazide (PRINZIDE,ZESTORETIC) 10-12.5 MG tablet Take 1 tablet by mouth daily.  Marland Kitchen omeprazole (PRILOSEC) 20 MG capsule Take 20 mg by mouth daily.  . sertraline (ZOLOFT) 100 MG tablet Take 100 mg by mouth daily.  Marland Kitchen SYNTHROID 137 MCG tablet TAKE 1 TABLET BY ORAL ROUTE MON-SUN  . Vitamin D, Ergocalciferol, (DRISDOL) 1.25 MG (50000 UT) CAPS capsule One capsule po twice weekly on Tuesdays/Fridays  . [DISCONTINUED] Vitamin D, Ergocalciferol, (DRISDOL) 50000 UNITS CAPS Take 50,000 Units by mouth 2 (  two) times a week.     Allergies:   Patient has no known allergies.   Social History   Tobacco Use  . Smoking status: Never Smoker  . Smokeless tobacco: Never Used  Substance Use Topics  . Alcohol use: Yes    Comment: rarely   . Drug use: No     Family Hx: The patient's family history includes Anemia in her mother; Asthma in her mother; Cancer in her maternal grandfather and paternal grandmother; Diabetes in her paternal aunt and paternal uncle; Gout in her  father; Hypertension in her mother; Kidney disease in her paternal uncle; Migraines in her maternal aunt and mother; Sarcoidosis in her father.  ROS:   Please see the history of present illness.    Review of Systems  Constitutional: Positive for fever.  HENT: Positive for postnasal drip.   Respiratory: Positive for cough and shortness of breath.   Cardiovascular: Negative.   Gastrointestinal: Positive for diarrhea.  Neurological: Negative.   Psychiatric/Behavioral: Negative.     All other systems reviewed and are negative.   Labs/Other Tests and Data Reviewed:    Recent Labs: 05/21/2018: BUN 12; Creatinine, Ser 0.87; Potassium 4.5; Sodium 140 08/28/2018: TSH 4.560   Recent Lipid Panel No results found for: CHOL, TRIG, HDL, CHOLHDL, LDLCALC, LDLDIRECT  Wt Readings from Last 3 Encounters:  02/04/19 (!) 441 lb (200 kg)  01/07/19 (!) 447 lb 9.6 oz (203 kg)  08/28/18 (!) 443 lb 3.2 oz (201 kg)     Exam:    Vital Signs:  Temp (!) 96.9 F (36.1 C) (Oral)   Wt (!) 441 lb (200 kg)   BMI 78.12 kg/m     Physical Exam  Constitutional: She is oriented to person, place, and time and well-developed, well-nourished, and in no distress.  HENT:  Head: Normocephalic and atraumatic.  Pulmonary/Chest: Effort normal.  Able to speak in full sentences  Neurological: She is alert and oriented to person, place, and time.  Psychiatric: Affect normal.  Nursing note and vitals reviewed.   ASSESSMENT & PLAN:    1. Cough  Her sx are suggestive of COVID. I will send rx Zpak to the pharmacy for her bronchitic symptoms. She is advised to go to ER should she develop worsening of SOB. [facility]   2. Diarrhea, unspecified type  Pt encouraged to eat a bland diet and to stay well hydrated.   3. Postnasal drip  Patient advised that her cough could be due to PND as well, especially given the season change.  She is encouraged to resume Zyrtec.   4. Vitamin D  deficiency disease  I sent rx to the pharmacy. Pt advised of immune boosting benefits.    COVID-19 Education: The signs and symptoms of COVID-19 were discussed with the patient and how to seek care for testing (follow up with PCP or arrange E-visit).  The importance of social distancing was discussed today.  Patient Risk:   After full review of this patients clinical status, I feel that they are at least moderate risk at this time. .     Medication Adjustments/Labs and Tests Ordered: Current medicines are reviewed at length with the patient today.  Concerns regarding medicines are outlined above.   Tests Ordered: No orders of the defined types were placed in this encounter.   Medication Changes: Meds ordered this encounter  Medications  . Vitamin D, Ergocalciferol, (DRISDOL) 1.25 MG (50000 UT) CAPS capsule  Sig: One capsule po twice weekly on Tuesdays/Fridays    Dispense:  8 capsule    Refill:  2  . azithromycin (ZITHROMAX Z-PAK) 250 MG tablet    Sig: Take 2 tablets (500 mg) on  Day 1,  followed by 1 tablet (250 mg) once daily on Days 2 through 5.    Dispense:  6 each    Refill:  0  . albuterol (VENTOLIN HFA) 108 (90 Base) MCG/ACT inhaler    Sig: Inhale 2 puffs into the lungs every 6 (six) hours as needed for wheezing or shortness of breath.    Dispense:  18 g    Refill:  1    Disposition:  Follow up prn  Signed, Maximino Greenland, MD

## 2019-02-06 LAB — NOVEL CORONAVIRUS, NAA: SARS-CoV-2, NAA: NOT DETECTED

## 2019-02-21 ENCOUNTER — Encounter: Payer: Self-pay | Admitting: Allergy & Immunology

## 2019-02-21 ENCOUNTER — Other Ambulatory Visit: Payer: Self-pay

## 2019-02-21 ENCOUNTER — Ambulatory Visit: Payer: Managed Care, Other (non HMO) | Admitting: Allergy & Immunology

## 2019-02-21 VITALS — BP 146/102 | HR 114 | Temp 98.2°F | Resp 16 | Ht 62.6 in | Wt >= 6400 oz

## 2019-02-21 DIAGNOSIS — J310 Chronic rhinitis: Secondary | ICD-10-CM

## 2019-02-21 DIAGNOSIS — T7840XD Allergy, unspecified, subsequent encounter: Secondary | ICD-10-CM

## 2019-02-21 MED ORDER — FLUTICASONE PROPIONATE 50 MCG/ACT NA SUSP: 1.0000 | Freq: Every day | NASAL | 5 refills | Status: DC

## 2019-02-21 NOTE — Patient Instructions (Addendum)
1. Chronic rhinitis - We are going to get some lab work to look for environmental allergies. - We will call you in 1-2 weeks with the results of the testing. - In the meantime, continue with Zyrtec (cetirizine) 10mg  daily as needed. - Continue with fluticasone one spray per nostril daily.   2. Allergic reaction - We are going to get some labs to look for the following allergies: orange, alpha gal (red meat sensitivity), honey, and carrot - We are also going to look at environmental allergies to see if these are contributing to your symptoms.  - We are going to look for mast cell activity with a serum tryptase.  - Anaphylaxis Management Plan provided.  - EpiPen is up to date.  - Take a diary of future reactions.  - We are going to get some thyroid antibodies to see if thyroid disease might be related to this.  - We are going to get some inflammatory markers to rule out autoimmunity as a cause of your symptoms.   3. Return in about 6 months (around 08/22/2019). This can be an in-person, a virtual Webex or a telephone follow up visit.   Please inform us of any Emergency Department visits, hospitalizations, or changes in symptoms. Call us before going to the ED for breathing or allergy symptoms since we might be able to fit you in for a sick visit. Feel free to contact us anytime with any questions, problems, or concerns.  It was a pleasure to meet you today!  Websites that have reliable patient information: 1. American Academy of Asthma, Allergy, and Immunology: www.aaaai.org 2. Food Allergy Research and Education (FARE): foodallergy.org 3. Mothers of Asthmatics: http://www.asthmacommunitynetwork.org 4. American College of Allergy, Asthma, and Immunology: www.acaai.org  "Like" [place] on Facebook and Instagram for our latest updates!      Make sure you are registered to vote! If you have moved or changed any of your contact information, you will need to get this updated before voting!  In  some cases, you MAY be able to register to vote online: [email or web address]    Voter ID laws are NOT going into effect for the General Election in November 2020! DO NOT let this stop you from exercising your right to vote!   Absentee voting is the SAFEST way to vote during the coronavirus pandemic!   Download and print an absentee ballot request form at [email or web address] or you can scan the QR code below with your smart phone:      More information on absentee ballots can be found here: [email or web address]

## 2019-02-21 NOTE — Progress Notes (Signed)
NEW PATIENT  Date of Service/Encounter:  02/21/19  Referring provider: Dorothyann Peng, MD   Assessment:   Chronic rhinitis   Allergic reaction - unknown trigger  Plan/Recommendations:   1. Chronic rhinitis - We are going to get some lab work to look for environmental allergies. - We will call you in 1-2 weeks with the results of the testing. - In the meantime, continue with Zyrtec (cetirizine) 10mg  daily as needed. - Continue with fluticasone one spray per nostril daily.   2. Allergic reaction - We are going to get some labs to look for the following allergies: orange, alpha gal (red meat sensitivity), honey, and carrot - We are also going to look at environmental allergies to see if these are contributing to your symptoms.  - We are going to look for mast cell activity with a serum tryptase.  - Anaphylaxis Management Plan provided.  - EpiPen is up to date.  - Take a diary of future reactions.  - We are going to get some thyroid antibodies to see if thyroid disease might be related to this.  - We are going to get some inflammatory markers to rule out autoimmunity as a cause of your symptoms.   3. Return in about 6 months (around 08/22/2019). This can be an in-person, a virtual Webex or a telephone follow up visit.     Subjective:   Sarah Barton is a 38 y.o. female presenting today for evaluation of  Chief Complaint  Patient presents with  . Food Intolerance    ate a salad from [employer], had a reaction  . Angioedema    eyes, lips,   . Urticaria    hives over body and face    Sarah Barton has a history of the following: Patient Active Problem List   Diagnosis Date Noted  . Essential hypertension, benign 08/28/2018  . Other abnormal glucose 08/28/2018  . Foot abrasion, right, initial encounter 02/22/2016  . Morbid obesity (HCC) 11/14/2011  . Primary hypothyroidism 11/14/2011    History obtained from: chart review and patient.  Sarah Barton was referred by  Sarah Kent, MD.     Sarah Barton is a 38 y.o. female presenting for an evaluation of an allergic reaction.  She ate the Zensation Zalad from [employer] around 2pm. Then she started having lip swelling fairly frequently. Then she started later itching around one hour afterwards. The next day she woke up and had eye swelling with welps. She tried taking antihistamines but it did not improve.  She then did a video visit and was told to take Claritin and was given a prednisone burst.  The symptoms continued.  Finally she went into urgent care and was given Depo-Medrol injection which did result in improvement in her symptoms.  She has never had any similar reaction in the past.  What is in the Zensation zalad? The Zensation Zalad is made to order with hand-breaded Chicken Fingerz, freshly chopped mixed greens, red cabbage, carrots and crispy wonton strips, all topped with Asian slaw and drizzled with a honey sesame teriyaki glaze. The salad is served with Citrus Vinaigrette dressing and a vegetable eggroll for $8.09   She has tolerated all of the things in the salad in the past.  She has had lettuce since that time as well as chicken.  She does not eat carrots all that often.  She does tolerate sesame as well as rice.  She has had honey very rarely. She eats peanuts, tree nuts, eggs, milk, seafood,  wheat, and soy without any problems whatsoever.  She does not eat a lot of red meat.  She does have allergic rhinitis.  Her worst time of the year is or when the seasons change.  She will take Zyrtec occasionally.  She does not use a nose spray.  She rarely gets antibiotics for sinus infections or pneumonias.  She has no history of asthma or atopic dermatitis.   Otherwise, there is no history of other atopic diseases, including asthma, drug allergies, stinging insect allergies, eczema, urticaria or contact dermatitis. There is no significant infectious history. Vaccinations are up to date.    Past Medical History:  Patient Active Problem List   Diagnosis Date Noted  . Essential hypertension, benign 08/28/2018  . Other abnormal glucose 08/28/2018  . Foot abrasion, right, initial encounter 02/22/2016  . Morbid obesity (HCC) 11/14/2011  . Primary hypothyroidism 11/14/2011    Medication List:  Allergies as of 02/21/2019   No Known Allergies     Medication List       Accurate as of February 21, 2019  4:00 PM. If you have any questions, ask your nurse or doctor.        albuterol 108 (90 Base) MCG/ACT inhaler Commonly known as: VENTOLIN HFA Inhale 2 puffs into the lungs every 6 (six) hours as needed for wheezing or shortness of breath.   ALPRAZolam 1 MG tablet Commonly known as: XANAX   EPINEPHrine 0.3 mg/0.3 mL Soaj injection Commonly known as: EPI-PEN Inject 0.3 mLs (0.3 mg total) into the muscle as needed for anaphylaxis.   fluticasone 50 MCG/ACT nasal spray Commonly known as: FLONASE Place 1 spray into both nostrils daily. Started by: Sarah Barton Orla Jolliff, MD   lisinopril-hydrochlorothiazide 10-12.5 MG tablet Commonly known as: ZESTORETIC Take 1 tablet by mouth daily.   omeprazole 20 MG capsule Commonly known as: PRILOSEC Take 20 mg by mouth daily.   sertraline 100 MG tablet Commonly known as: ZOLOFT Take 100 mg by mouth daily.   Synthroid 137 MCG tablet Generic drug: levothyroxine TAKE 1 TABLET BY ORAL ROUTE MON-SUN What changed: Another medication with the same name was removed. Continue taking this medication, and follow the directions you see here. Changed by: Sarah Barton Sarah Morad, MD   Vitamin D (Ergocalciferol) 1.25 MG (50000 UT) Caps capsule Commonly known as: DRISDOL One capsule po twice weekly on Tuesdays/Fridays       Birth History: non-contributory  Developmental History: non-contributory  Past Surgical History: Past Surgical History:  Procedure Laterality Date  . WISDOM TOOTH EXTRACTION       Family History: Family History  Problem Relation Age  of Onset  . Hypertension Mother   . Migraines Mother   . Anemia Mother   . Asthma Mother   . Cancer Maternal Grandfather        Colon  . Cancer Paternal Grandmother        Breast  . Diabetes Paternal Uncle   . Kidney disease Paternal Uncle   . Diabetes Paternal Aunt   . Migraines Maternal Aunt   . Sarcoidosis Father   . Gout Father      Social History: Pierra lives at home by herself.  She lives in an apartment with carpeting throughout.  There are no animals inside or outside of the home.  She has electric heating and central cooling.  She does have dust mite covers on her bed, but not her pillows.  There is no tobacco exposure.  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  Review of Systems  Constitutional: Negative.  Negative for chills, fever, malaise/fatigue and weight loss.  HENT: Positive for congestion. Negative for ear discharge, ear pain, sinus pain and sore throat.        Positive for rhinorrhea.  Eyes: Negative for pain, discharge and redness.  Respiratory: Negative for cough, sputum production, shortness of breath and wheezing.   Cardiovascular: Negative.  Negative for chest pain and palpitations.  Gastrointestinal: Negative for abdominal pain, constipation, diarrhea, heartburn, nausea and vomiting.  Skin: Negative.  Negative for itching and rash.  Neurological: Negative for dizziness and headaches.  Endo/Heme/Allergies: Positive for environmental allergies. Does not bruise/bleed easily.       Objective:   Blood pressure (!) 146/102, pulse (!) 114, temperature 98.2 F (36.8 C), temperature source Temporal, resp. rate 16, height 5' 2.6" (1.59 m), weight (!) 444 lb (201.4 kg), SpO2 96 %. Body mass index is 79.66 kg/m.   Physical Exam:   Physical Exam  Constitutional: She appears well-developed.  Pleasant female. Obese. Friendly.   HENT:  Head: Normocephalic and atraumatic.  Right Ear: Tympanic membrane, external ear and ear canal normal. No  drainage, swelling or tenderness. Tympanic membrane is not injected, not scarred, not erythematous, not retracted and not bulging.  Left Ear: Tympanic membrane, external ear and ear canal normal. No drainage, swelling or tenderness. Tympanic membrane is not injected, not scarred, not erythematous, not retracted and not bulging.  Nose: Mucosal edema and rhinorrhea present. No nasal deformity or septal deviation. No epistaxis. Right sinus exhibits no maxillary sinus tenderness and no frontal sinus tenderness. Left sinus exhibits no maxillary sinus tenderness and no frontal sinus tenderness.  Mouth/Throat: Uvula is midline and oropharynx is clear and moist. Mucous membranes are not pale and not dry.  Cobblestoning present. There are tonsils that are 2-3+ bilaterally.   Eyes: Pupils are equal, round, and reactive to light. Conjunctivae and EOM are normal. Right eye exhibits no chemosis and no discharge. Left eye exhibits no chemosis and no discharge. Right conjunctiva is not injected. Left conjunctiva is not injected.  Cardiovascular: Normal rate, regular rhythm and normal heart sounds.  Respiratory: Effort normal and breath sounds normal. No accessory muscle usage. No tachypnea. No respiratory distress. She has no wheezes. She has no rhonchi. She has no rales. She exhibits no tenderness.  Moving air well in all lung fields.   GI: There is no abdominal tenderness. There is no rebound and no guarding.  Lymphadenopathy:       Head (right side): No submandibular, no tonsillar and no occipital adenopathy present.       Head (left side): No submandibular, no tonsillar and no occipital adenopathy present.    She has no cervical adenopathy.  Neurological: She is alert.  Skin: No abrasion, no petechiae and no rash noted. Rash is not papular, not vesicular and not urticarial. No erythema. No pallor.  No urticarial or eczematous lesions noted.   Psychiatric: She has a normal mood and affect.     Diagnostic  studies: labs sent instead        Salvatore Marvel, MD [facility]

## 2019-02-27 LAB — CBC WITH DIFFERENTIAL/PLATELET
Basophils Absolute: 0.1 10*3/uL (ref 0.0–0.2)
Basos: 1 %
EOS (ABSOLUTE): 0.1 10*3/uL (ref 0.0–0.4)
Eos: 1 %
Hematocrit: 37.8 % (ref 34.0–46.6)
Hemoglobin: 12.4 g/dL (ref 11.1–15.9)
Immature Grans (Abs): 0 10*3/uL (ref 0.0–0.1)
Immature Granulocytes: 0 %
Lymphocytes Absolute: 2.1 10*3/uL (ref 0.7–3.1)
Lymphs: 32 %
MCH: 27.2 pg (ref 26.6–33.0)
MCHC: 32.8 g/dL (ref 31.5–35.7)
MCV: 83 fL (ref 79–97)
Monocytes Absolute: 0.4 10*3/uL (ref 0.1–0.9)
Monocytes: 6 %
Neutrophils Absolute: 3.9 10*3/uL (ref 1.4–7.0)
Neutrophils: 60 %
Platelets: 257 10*3/uL (ref 150–450)
RBC: 4.56 x10E6/uL (ref 3.77–5.28)
RDW: 15.2 % (ref 11.7–15.4)
WBC: 6.5 10*3/uL (ref 3.4–10.8)

## 2019-02-27 LAB — TRYPTASE: Tryptase: 5.1 ug/L (ref 2.2–13.2)

## 2019-02-27 LAB — IGE+ALLERGENS ZONE 2(30)

## 2019-02-27 LAB — ALPHA-GAL PANEL
Alpha Gal IgE*: <0.1 kU/L (ref <0.10)
Beef (Bos spp) IgE: <0.1 kU/L (ref <0.35)
Class Interpretation: 0
Class Interpretation: 0
Class Interpretation: 0
Lamb/Mutton (Ovis spp) IgE: <0.1 kU/L (ref <0.35)
Pork (Sus spp) IgE: <0.1 kU/L (ref <0.35)

## 2019-02-27 LAB — THYROID ANTIBODIES
Thyroglobulin Antibody: 314.6 IU/mL — ABNORMAL HIGH (ref 0.0–0.9)
Thyroperoxidase Ab SerPl-aCnc: 131 IU/mL — ABNORMAL HIGH (ref 0–34)

## 2019-02-27 LAB — SEDIMENTATION RATE: Sed Rate: 48 mm/hr — ABNORMAL HIGH (ref 0–32)

## 2019-02-27 LAB — ANA W/REFLEX IF POSITIVE: Anti Nuclear Antibody (ANA): NEGATIVE

## 2019-02-27 LAB — C-REACTIVE PROTEIN: CRP: 24 mg/L — ABNORMAL HIGH (ref 0–10)

## 2019-02-27 LAB — F247-IGE HONEY: F247-IgE Honey: <0.1 kU/L

## 2019-02-27 LAB — RHEUMATOID FACTOR: Rheumatoid fact SerPl-aCnc: <10 IU/mL (ref 0.0–13.9)

## 2019-02-27 LAB — ALLERGEN CARROT: Allergen Carrot IgE: <0.1 kU/L

## 2019-02-27 LAB — ALLERGEN, ORANGE F33: Orange: <0.1 kU/L

## 2019-03-01 ENCOUNTER — Encounter: Payer: Self-pay | Admitting: Allergy & Immunology

## 2019-03-04 ENCOUNTER — Telehealth: Payer: Self-pay | Admitting: *Deleted

## 2019-03-04 NOTE — Telephone Encounter (Signed)
Please advise 

## 2019-03-05 NOTE — Telephone Encounter (Signed)
My apologies.   From the patient: Yes I do have hypothyroidism. I do have a question Crp and I cannot remember the other initial that corresponds with the autoimmune disorder?

## 2019-03-05 NOTE — Telephone Encounter (Signed)
I am unsure what I am supposed to advise about. There is nothing connected to this encounter.  Please advise.   Salvatore Marvel, MD [facility] [facility]

## 2019-03-06 NOTE — Telephone Encounter (Signed)
CRP and ESR are non-specific markers of inflammation, so they do not necessarily point to a particular cause of the inflammation. It could be related to your thyroid disease. I think we should recheck these at your next visit to see if they are still inflamed. It is reassuring that your ANA was negative. This has excellent negative predictive value, meaning a negative results strongly points away from something like lupus or rheumatoid arthritis. If your ESR and CRP are elevated at the next visit, we will refer you to see Rheumatology.  Salvatore Marvel, MD [facility] [place]

## 2019-03-06 NOTE — Telephone Encounter (Signed)
This message has been routed to the patient via [place].

## 2019-04-24 ENCOUNTER — Encounter: Payer: Self-pay | Admitting: Nurse Practitioner

## 2019-04-24 ENCOUNTER — Other Ambulatory Visit: Payer: Self-pay

## 2019-04-24 ENCOUNTER — Ambulatory Visit (INDEPENDENT_AMBULATORY_CARE_PROVIDER_SITE_OTHER): Payer: Managed Care, Other (non HMO) | Admitting: Nurse Practitioner

## 2019-04-24 VITALS — BP 126/78 | HR 97 | Temp 98.4°F | Ht 62.6 in | Wt >= 6400 oz

## 2019-04-24 DIAGNOSIS — E039 Hypothyroidism, unspecified: Secondary | ICD-10-CM

## 2019-04-24 DIAGNOSIS — I10 Essential (primary) hypertension: Secondary | ICD-10-CM | POA: Diagnosis not present

## 2019-04-24 DIAGNOSIS — Z23 Encounter for immunization: Secondary | ICD-10-CM

## 2019-04-24 NOTE — Patient Instructions (Signed)
Influenza Virus Vaccine (Flucelvax) What is this medicine? INFLUENZA VIRUS VACCINE (in floo EN zuh VAHY ruhs vak SEEN) helps to reduce the risk of getting influenza also known as the flu. The vaccine only helps protect you against some strains of the flu. This medicine may be used for other purposes; ask your health care provider or pharmacist if you have questions. COMMON BRAND NAME(S): FLUCELVAX What should I tell my health care provider before I take this medicine? They need to know if you have any of these conditions:  bleeding disorder like hemophilia  fever or infection  Guillain-Barre syndrome or other neurological problems  immune system problems  infection with the human immunodeficiency virus (HIV) or AIDS  low blood platelet counts  multiple sclerosis  an unusual or allergic reaction to influenza virus vaccine, other medicines, foods, dyes or preservatives  pregnant or trying to get pregnant  breast-feeding How should I use this medicine? This vaccine is for injection into a muscle. It is given by a health care professional. A copy of Vaccine Information Statements will be given before each vaccination. Read this sheet carefully each time. The sheet may change frequently. Talk to your pediatrician regarding the use of this medicine in children. Special care may be needed. Overdosage: If you think you've taken too much of this medicine contact a poison control center or emergency room at once. Overdosage: If you think you have taken too much of this medicine contact a poison control center or emergency room at once. NOTE: This medicine is only for you. Do not share this medicine with others. What if I miss a dose? This does not apply. What may interact with this medicine?  chemotherapy or radiation therapy  medicines that lower your immune system like etanercept, anakinra, infliximab, and adalimumab  medicines that treat or prevent blood clots like  warfarin  phenytoin  steroid medicines like prednisone or cortisone  theophylline  vaccines This list may not describe all possible interactions. Give your health care provider a list of all the medicines, herbs, non-prescription drugs, or dietary supplements you use. Also tell them if you smoke, drink alcohol, or use illegal drugs. Some items may interact with your medicine. What should I watch for while using this medicine? Report any side effects that do not go away within 3 days to your doctor or health care professional. Call your health care provider if any unusual symptoms occur within 6 weeks of receiving this vaccine. You may still catch the flu, but the illness is not usually as bad. You cannot get the flu from the vaccine. The vaccine will not protect against colds or other illnesses that may cause fever. The vaccine is needed every year. What side effects may I notice from receiving this medicine? Side effects that you should report to your doctor or health care professional as soon as possible:  allergic reactions like skin rash, itching or hives, swelling of the face, lips, or tongue Side effects that usually do not require medical attention (Report these to your doctor or health care professional if they continue or are bothersome.):  fever  headache  muscle aches and pains  pain, tenderness, redness, or swelling at the injection site  tiredness This list may not describe all possible side effects. Call your doctor for medical advice about side effects. You may report side effects to FDA at 1-800-FDA-1088. Where should I keep my medicine? The vaccine will be given by a health care professional in a clinic, pharmacy, doctor's   office, or other health care setting. You will not be given vaccine doses to store at home. NOTE: This sheet is a summary. It may not cover all possible information. If you have questions about this medicine, talk to your doctor, pharmacist, or  health care provider.  2020 Elsevier/Gold Standard (2011-04-13 14:06:47)  

## 2019-04-24 NOTE — Progress Notes (Signed)
This visit occurred during the SARS-CoV-2 public health emergency.  Safety protocols were in place, including screening questions prior to the visit, additional usage of staff PPE, and extensive cleaning of exam room while observing appropriate contact time as indicated for disinfecting solutions.  Subjective:     Patient ID: Sarah Barton , female    DOB: 1980/09/24 , 38 y.o.   MRN: [id number]   Chief Complaint  Patient presents with  . Hypothyroidism  . Diabetes    HPI  Wt Readings from Last 3 Encounters: 04/24/19 : (!) 442 lb 3.2 oz (200.6 kg) 02/21/19 : (!) 444 lb (201.4 kg) 02/04/19 : (!) 441 lb (200 kg)    Thyroid Problem Presents for follow-up visit. Patient reports no anxiety, fatigue or palpitations. The symptoms have been stable.  Hypertension This is a chronic problem. The current episode started more than 1 year ago. The problem is unchanged. The problem is controlled. Pertinent negatives include no anxiety, headaches or palpitations. There are no associated agents to hypertension. There are no known risk factors for coronary artery disease. Past treatments include diuretics and ACE inhibitors. Compliance problems include exercise and diet.  There is no history of angina. Identifiable causes of hypertension include a thyroid problem. There is no history of chronic renal disease.     Past Medical History:  Diagnosis Date  . Angio-edema   . Depression   . Dysmetabolic syndrome   . Hypertension   . Hypothyroidism   . Morbid obesity ([facility])   . Obesity, morbid ([facility])   . Sciatica   . Thyroid disease   . Urticaria      Family History  Problem Relation Age of Onset  . Hypertension Mother   . Migraines Mother   . Anemia Mother   . Asthma Mother   . Cancer Maternal Grandfather        Colon  . Cancer Paternal Grandmother        Breast  . Diabetes Paternal Uncle   . Kidney disease Paternal Uncle   . Diabetes Paternal Aunt   . Migraines Maternal Aunt   .  Sarcoidosis Father   . Gout Father      Current Outpatient Medications:  .  albuterol (VENTOLIN HFA) 108 (90 Base) MCG/ACT inhaler, Inhale 2 puffs into the lungs every 6 (six) hours as needed for wheezing or shortness of breath., Disp: 18 g, Rfl: 1 .  ALPRAZolam (XANAX) 1 MG tablet, , Disp: , Rfl:  .  EPINEPHrine 0.3 mg/0.3 mL IJ SOAJ injection, Inject 0.3 mLs (0.3 mg total) into the muscle as needed for anaphylaxis., Disp: 2 each, Rfl: 3 .  fluticasone (FLONASE) 50 MCG/ACT nasal spray, Place 1 spray into both nostrils daily., Disp: 16 g, Rfl: 5 .  lisinopril-hydrochlorothiazide (PRINZIDE,ZESTORETIC) 10-12.5 MG tablet, Take 1 tablet by mouth daily., Disp: 90 tablet, Rfl: 1 .  omeprazole (PRILOSEC) 20 MG capsule, Take 20 mg by mouth daily., Disp: , Rfl:  .  sertraline (ZOLOFT) 100 MG tablet, Take 100 mg by mouth daily., Disp: , Rfl:  .  SYNTHROID 137 MCG tablet, TAKE 1 TABLET BY ORAL ROUTE MON-SUN, Disp: 90 tablet, Rfl: 0 .  Vitamin D, Ergocalciferol, (DRISDOL) 1.25 MG (50000 UT) CAPS capsule, One capsule po twice weekly on Tuesdays/Fridays, Disp: 8 capsule, Rfl: 2   No Known Allergies   Review of Systems  Constitutional: Negative.  Negative for fatigue.  Respiratory: Negative.   Cardiovascular: Negative.  Negative for palpitations and leg swelling.  Neurological: Negative for  headaches.  Psychiatric/Behavioral: Negative.  The patient is not nervous/anxious.      Today's Vitals   04/24/19 1024  BP: 126/78  Pulse: 97  Temp: 98.4 F (36.9 C)  TempSrc: Oral  Weight: (!) 442 lb 3.2 oz (200.6 kg)  Height: 5' 2.6" (1.59 m)   Body mass index is 79.34 kg/m.   Objective:  Physical Exam Vitals signs reviewed.  Constitutional:      General: She is not in acute distress.    Appearance: Normal appearance. She is obese.     Comments: Morbid obesity  Cardiovascular:     Pulses: Normal pulses.     Heart sounds: Normal heart sounds. No murmur.  Pulmonary:     Effort: Pulmonary effort  is normal. No respiratory distress.     Breath sounds: Normal breath sounds.  Neurological:     Mental Status: She is alert.         Assessment And Plan:     1. Primary hypothyroidism  Chronic, her thyroid antibodies were quite elevated with the allergist, I have advised her to cut back on her gluten and sweets to see if this helps - T3, free - T4 - TSH  2. Need for vaccination  Influenza vaccine given in office  Advised to take Tylenol as needed for muscle aches or fever - Flu Vaccine QUAD 6+ mos PF IM (Fluarix Quad PF)  3. Essential hypertension, benign  Blood pressure is well controlled, chronic  Continue with current medications   Arnette Felts, FNP    THE PATIENT IS ENCOURAGED TO PRACTICE SOCIAL DISTANCING DUE TO THE COVID-19 PANDEMIC.

## 2019-04-25 LAB — TSH: TSH: 4.29 u[IU]/mL (ref 0.450–4.500)

## 2019-04-25 LAB — T3, FREE: T3, Free: 2.1 pg/mL (ref 2.0–4.4)

## 2019-04-25 LAB — T4: T4, Total: 9 ug/dL (ref 4.5–12.0)

## 2019-05-07 ENCOUNTER — Other Ambulatory Visit: Payer: Self-pay | Admitting: Internal Medicine

## 2019-06-07 ENCOUNTER — Encounter: Payer: Self-pay | Admitting: Internal Medicine

## 2019-06-07 ENCOUNTER — Other Ambulatory Visit: Payer: Self-pay | Admitting: Internal Medicine

## 2019-06-07 MED ORDER — SYNTHROID 137 MCG PO TABS: ORAL_TABLET | ORAL | 0 refills | Status: DC

## 2019-06-12 DIAGNOSIS — Z20828 Contact with and (suspected) exposure to other viral communicable diseases: Secondary | ICD-10-CM | POA: Diagnosis not present

## 2019-06-15 ENCOUNTER — Encounter: Payer: Self-pay | Admitting: Internal Medicine

## 2019-06-17 ENCOUNTER — Telehealth (INDEPENDENT_AMBULATORY_CARE_PROVIDER_SITE_OTHER): Payer: BC Managed Care – PPO | Admitting: Internal Medicine

## 2019-06-17 ENCOUNTER — Encounter: Payer: Self-pay | Admitting: Internal Medicine

## 2019-06-17 ENCOUNTER — Other Ambulatory Visit: Payer: Self-pay

## 2019-06-17 VITALS — Temp 95.9°F

## 2019-06-17 DIAGNOSIS — I10 Essential (primary) hypertension: Secondary | ICD-10-CM

## 2019-06-17 DIAGNOSIS — U071 COVID-19: Secondary | ICD-10-CM

## 2019-06-17 DIAGNOSIS — J40 Bronchitis, not specified as acute or chronic: Secondary | ICD-10-CM | POA: Diagnosis not present

## 2019-06-17 MED ORDER — ALBUTEROL SULFATE HFA 108 (90 BASE) MCG/ACT IN AERS: 2.0000 | INHALATION_SPRAY | Freq: Four times a day (QID) | RESPIRATORY_TRACT | 1 refills | Status: AC | PRN

## 2019-06-17 MED ORDER — AZITHROMYCIN 250 MG PO TABS: ORAL_TABLET | ORAL | 0 refills | Status: AC

## 2019-06-17 NOTE — Progress Notes (Signed)
Virtual Visit via Video   This visit type was conducted due to national recommendations for restrictions regarding the COVID-19 Pandemic (e.g. social distancing) in an effort to limit this patient's exposure and mitigate transmission in our community.  Due to her co-morbid illnesses, this patient is at least at moderate risk for complications without adequate follow up.  This format is felt to be most appropriate for this patient at this time.  All issues noted in this document were discussed and addressed.  A limited physical exam was performed with this format.    This visit type was conducted due to national recommendations for restrictions regarding the COVID-19 Pandemic (e.g. social distancing) in an effort to limit this patient's exposure and mitigate transmission in our community.  Patients identity confirmed using two different identifiers.  This format is felt to be most appropriate for this patient at this time.  All issues noted in this document were discussed and addressed.  No physical exam was performed (except for noted visual exam findings with Video Visits).    Date:  06/17/2019   ID:  Sarah Barton, DOB 10-07-1980, MRN [id number]  Patient Location:  Home  Provider location:   Office    Chief Complaint:  "I have COVID"  History of Present Illness:    Sarah Barton is a 39 y.o. female who presents via video conferencing for a telehealth visit today.    The patient does have symptoms concerning for COVID-19 infection (fever, chills, cough, or new shortness of breath).   She presents today for virtual visit. She prefers this method of contact due to COVID-19 pandemic.  She presents today for guidance regarding recently diagnosed COVID infection. She reports developing headache, cough weekend of 1/23.  She went for testing at [facility] on 1/27 and found out on 1/30 that she had COVID. She has been taking any OTC Immune support.  She had fever late last week. She denies worsening SOB.       Past Medical History:  Diagnosis Date  . Angio-edema   . Depression   . Dysmetabolic syndrome   . Hypertension   . Hypothyroidism   . Morbid obesity (HCC)   . Obesity, morbid (HCC)   . Sciatica   . Thyroid disease   . Urticaria    Past Surgical History:  Procedure Laterality Date  . WISDOM TOOTH EXTRACTION       Current Meds  Medication Sig  . albuterol (VENTOLIN HFA) 108 (90 Base) MCG/ACT inhaler Inhale 2 puffs into the lungs every 6 (six) hours as needed for wheezing or shortness of breath.  . ALPRAZolam (XANAX) 1 MG tablet   . EPINEPHrine 0.3 mg/0.3 mL IJ SOAJ injection Inject 0.3 mLs (0.3 mg total) into the muscle as needed for anaphylaxis.  . fluticasone (FLONASE) 50 MCG/ACT nasal spray Place 1 spray into both nostrils daily.  Marland Kitchen lisinopril-hydrochlorothiazide (PRINZIDE,ZESTORETIC) 10-12.5 MG tablet Take 1 tablet by mouth daily.  Marland Kitchen omeprazole (PRILOSEC) 20 MG capsule Take 20 mg by mouth daily.  . sertraline (ZOLOFT) 100 MG tablet Take 100 mg by mouth daily.  Marland Kitchen SYNTHROID 137 MCG tablet Take 1 tablet by mouth Monday - Sunday  . Vitamin D, Ergocalciferol, (DRISDOL) 1.25 MG (50000 UNIT) CAPS capsule TAKE 1 CAPSULE TWICE WEEKLY ON TUESDAYS/FRIDAYS  . [DISCONTINUED] albuterol (VENTOLIN HFA) 108 (90 Base) MCG/ACT inhaler Inhale 2 puffs into the lungs every 6 (six) hours as needed for wheezing or shortness of breath.     Allergies:   Patient has  no known allergies.   Social History   Tobacco Use  . Smoking status: Never Smoker  . Smokeless tobacco: Never Used  Substance Use Topics  . Alcohol use: Yes    Comment: rarely   . Drug use: No     Family Hx: The patient's family history includes Anemia in her mother; Asthma in her mother; Cancer in her maternal grandfather and paternal grandmother; Diabetes in her paternal aunt and paternal uncle; Gout in her father; Hypertension in her mother; Kidney disease in her paternal uncle; Migraines in her maternal aunt and mother;  Sarcoidosis in her father.  ROS:   Please see the history of present illness.    Review of Systems  Constitutional: Positive for malaise/fatigue.  Respiratory: Positive for cough.   Cardiovascular: Negative.   Gastrointestinal: Negative.   Neurological: Positive for headaches.  Psychiatric/Behavioral: Negative.     All other systems reviewed and are negative.   Labs/Other Tests and Data Reviewed:    Recent Labs: 02/21/2019: Hemoglobin 12.4; Platelets 257 04/24/2019: TSH 4.290   Recent Lipid Panel No results found for: CHOL, TRIG, HDL, CHOLHDL, LDLCALC, LDLDIRECT  Wt Readings from Last 3 Encounters:  04/24/19 (!) 442 lb 3.2 oz (200.6 kg)  02/21/19 (!) 444 lb (201.4 kg)  02/04/19 (!) 441 lb (200 kg)     Exam:    Vital Signs:  Temp (!) 95.9 F (35.5 C) (Temporal)     Physical Exam  Constitutional: She is oriented to person, place, and time and well-developed, well-nourished, and in no distress.  HENT:  Head: Normocephalic and atraumatic.  Pulmonary/Chest: Effort normal.  She is able to speak in full sentences w/o labored breathing.   Musculoskeletal:     Cervical back: Normal range of motion.  Neurological: She is alert and oriented to person, place, and time.  Psychiatric: Affect normal.  Nursing note and vitals reviewed.   ASSESSMENT & PLAN:     1. COVID-19  She is encouraged to stay home ten days from testing date. Advised she can return to work after she has had 72 hours without symptoms/fever. She will continue with her Immune Support daily.   2. Bronchitis due to COVID-19 virus  She was given rx zpak and albuterol inhaler to use prn. She is encouraged to complete full abx course. She is encouraged to go to ER if she develops worsening SOB.  She is encouraged to call the office   3. Essential hypertension, benign  Chronic. She is not taking her meds at this time. She has not been monitoring her BP. She agrees to rto in 3 weeks for nurse visit.   COVID-19  Education: The signs and symptoms of COVID-19 were discussed with the patient and how to seek care for testing (follow up with PCP or arrange E-visit).  The importance of social distancing was discussed today.  Patient Risk:   After full review of this patients clinical status, I feel that they are at least moderate risk at this time.    Medication Adjustments/Labs and Tests Ordered: Current medicines are reviewed at length with the patient today.  Concerns regarding medicines are outlined above.   Tests Ordered: No orders of the defined types were placed in this encounter.   Medication Changes: Meds ordered this encounter  Medications  . albuterol (VENTOLIN HFA) 108 (90 Base) MCG/ACT inhaler    Sig: Inhale 2 puffs into the lungs every 6 (six) hours as needed for wheezing or shortness of breath.    Dispense:  18 g    Refill:  1  . azithromycin (ZITHROMAX) 250 MG tablet    Sig: Take 2 tablets (500 mg) on  Day 1,  followed by 1 tablet (250 mg) once daily on Days 2 through 5.    Dispense:  6 each    Refill:  0    Disposition:  Follow up in 3 week(s)  Signed, Maximino Greenland, MD

## 2019-06-17 NOTE — Patient Instructions (Signed)
COVID-19 COVID-19 is a respiratory infection that is caused by a virus called severe acute respiratory syndrome coronavirus 2 (SARS-CoV-2). The disease is also known as coronavirus disease or novel coronavirus. In some people, the virus may not cause any symptoms. In others, it may cause a serious infection. The infection can get worse quickly and can lead to complications, such as:  Pneumonia, or infection of the lungs.  Acute respiratory distress syndrome or ARDS. This is a condition in which fluid build-up in the lungs prevents the lungs from filling with air and passing oxygen into the blood.  Acute respiratory failure. This is a condition in which there is not enough oxygen passing from the lungs to the body or when carbon dioxide is not passing from the lungs out of the body.  Sepsis or septic shock. This is a serious bodily reaction to an infection.  Blood clotting problems.  Secondary infections due to bacteria or fungus.  Organ failure. This is when your body's organs stop working. The virus that causes COVID-19 is contagious. This means that it can spread from person to person through droplets from coughs and sneezes (respiratory secretions). What are the causes? This illness is caused by a virus. You may catch the virus by:  Breathing in droplets from an infected person. Droplets can be spread by a person breathing, speaking, singing, coughing, or sneezing.  Touching something, like a table or a doorknob, that was exposed to the virus (contaminated) and then touching your mouth, nose, or eyes. What increases the risk? Risk for infection You are more likely to be infected with this virus if you:  Are within 6 feet (2 meters) of a person with COVID-19.  Provide care for or live with a person who is infected with COVID-19.  Spend time in crowded indoor spaces or live in shared housing. Risk for serious illness You are more likely to become seriously ill from the virus if you:   Are 50 years of age or older. The higher your age, the more you are at risk for serious illness.  Live in a nursing home or long-term care facility.  Have cancer.  Have a long-term (chronic) disease such as: ? Chronic lung disease, including chronic obstructive pulmonary disease or asthma. ? A long-term disease that lowers your body's ability to fight infection (immunocompromised). ? Heart disease, including heart failure, a condition in which the arteries that lead to the heart become narrow or blocked (coronary artery disease), a disease which makes the heart muscle thick, weak, or stiff (cardiomyopathy). ? Diabetes. ? Chronic kidney disease. ? Sickle cell disease, a condition in which red blood cells have an abnormal "sickle" shape. ? Liver disease.  Are obese. What are the signs or symptoms? Symptoms of this condition can range from mild to severe. Symptoms may appear any time from 2 to 14 days after being exposed to the virus. They include:  A fever or chills.  A cough.  Difficulty breathing.  Headaches, body aches, or muscle aches.  Runny or stuffy (congested) nose.  A sore throat.  New loss of taste or smell. Some people may also have stomach problems, such as nausea, vomiting, or diarrhea. Other people may not have any symptoms of COVID-19. How is this diagnosed? This condition may be diagnosed based on:  Your signs and symptoms, especially if: ? You live in an area with a COVID-19 outbreak. ? You recently traveled to or from an area where the virus is common. ? You   provide care for or live with a person who was diagnosed with COVID-19. ? You were exposed to a person who was diagnosed with COVID-19.  A physical exam.  Lab tests, which may include: ? Taking a sample of fluid from the back of your nose and throat (nasopharyngeal fluid), your nose, or your throat using a swab. ? A sample of mucus from your lungs (sputum). ? Blood tests.  Imaging tests, which  may include, X-rays, CT scan, or ultrasound. How is this treated? At present, there is no medicine to treat COVID-19. Medicines that treat other diseases are being used on a trial basis to see if they are effective against COVID-19. Your health care provider will talk with you about ways to treat your symptoms. For most people, the infection is mild and can be managed at home with rest, fluids, and over-the-counter medicines. Treatment for a serious infection usually takes places in a hospital intensive care unit (ICU). It may include one or more of the following treatments. These treatments are given until your symptoms improve.  Receiving fluids and medicines through an IV.  Supplemental oxygen. Extra oxygen is given through a tube in the nose, a face mask, or a Lubeck.  Positioning you to lie on your stomach (prone position). This makes it easier for oxygen to get into the lungs.  Continuous positive airway pressure (CPAP) or bi-level positive airway pressure (BPAP) machine. This treatment uses mild air pressure to keep the airways open. A tube that is connected to a motor delivers oxygen to the body.  Ventilator. This treatment moves air into and out of the lungs by using a tube that is placed in your windpipe.  Tracheostomy. This is a procedure to create a hole in the neck so that a breathing tube can be inserted.  Extracorporeal membrane oxygenation (ECMO). This procedure gives the lungs a chance to recover by taking over the functions of the heart and lungs. It supplies oxygen to the body and removes carbon dioxide. Follow these instructions at home: Lifestyle  If you are sick, stay home except to get medical care. Your health care provider will tell you how long to stay home. Call your health care provider before you go for medical care.  Rest at home as told by your health care provider.  Do not use any products that contain nicotine or tobacco, such as cigarettes, e-cigarettes, and  chewing tobacco. If you need help quitting, ask your health care provider.  Return to your normal activities as told by your health care provider. Ask your health care provider what activities are safe for you. General instructions  Take over-the-counter and prescription medicines only as told by your health care provider.  Drink enough fluid to keep your urine pale yellow.  Keep all follow-up visits as told by your health care provider. This is important. How is this prevented?  There is no vaccine to help prevent COVID-19 infection. However, there are steps you can take to protect yourself and others from this virus. To protect yourself:   Do not travel to areas where COVID-19 is a risk. The areas where COVID-19 is reported change often. To identify high-risk areas and travel restrictions, check the CDC travel website: wwwnc.cdc.gov/travel/notices  If you live in, or must travel to, an area where COVID-19 is a risk, take precautions to avoid infection. ? Stay away from people who are sick. ? Wash your hands often with soap and water for 20 seconds. If soap and water   are not available, use an alcohol-based hand sanitizer. ? Avoid touching your mouth, face, eyes, or nose. ? Avoid going out in public, follow guidance from your state and local health authorities. ? If you must go out in public, wear a cloth face covering or face mask. Make sure your mask covers your nose and mouth. ? Avoid crowded indoor spaces. Stay at least 6 feet (2 meters) away from others. ? Disinfect objects and surfaces that are frequently touched every day. This may include:  Counters and tables.  Doorknobs and light switches.  Sinks and faucets.  Electronics, such as phones, remote controls, keyboards, computers, and tablets. To protect others: If you have symptoms of COVID-19, take steps to prevent the virus from spreading to others.  If you think you have a COVID-19 infection, contact your health care  provider right away. Tell your health care team that you think you may have a COVID-19 infection.  Stay home. Leave your house only to seek medical care. Do not use public transport.  Do not travel while you are sick.  Wash your hands often with soap and water for 20 seconds. If soap and water are not available, use alcohol-based hand sanitizer.  Stay away from other members of your household. Let healthy household members care for children and pets, if possible. If you have to care for children or pets, wash your hands often and wear a mask. If possible, stay in your own room, separate from others. Use a different bathroom.  Make sure that all people in your household wash their hands well and often.  Cough or sneeze into a tissue or your sleeve or elbow. Do not cough or sneeze into your hand or into the air.  Wear a cloth face covering or face mask. Make sure your mask covers your nose and mouth. Where to find more information  Centers for Disease Control and Prevention: www.cdc.gov/coronavirus/2019-ncov/index.html  World Health Organization: www.who.int/health-topics/coronavirus Contact a health care provider if:  You live in or have traveled to an area where COVID-19 is a risk and you have symptoms of the infection.  You have had contact with someone who has COVID-19 and you have symptoms of the infection. Get help right away if:  You have trouble breathing.  You have pain or pressure in your chest.  You have confusion.  You have bluish lips and fingernails.  You have difficulty waking from sleep.  You have symptoms that get worse. These symptoms may represent a serious problem that is an emergency. Do not wait to see if the symptoms will go away. Get medical help right away. Call your local emergency services (911 in the [place] Do not drive yourself to the hospital. Let the emergency medical personnel know if you think you have COVID-19. Summary  COVID-19 is a  respiratory infection that is caused by a virus. It is also known as coronavirus disease or novel coronavirus. It can cause serious infections, such as pneumonia, acute respiratory distress syndrome, acute respiratory failure, or sepsis.  The virus that causes COVID-19 is contagious. This means that it can spread from person to person through droplets from breathing, speaking, singing, coughing, or sneezing.  You are more likely to develop a serious illness if you are 50 years of age or older, have a weak immune system, live in a nursing home, or have chronic disease.  There is no medicine to treat COVID-19. Your health care provider will talk with you about ways to treat your symptoms.    Take steps to protect yourself and others from infection. Wash your hands often and disinfect objects and surfaces that are frequently touched every day. Stay away from people who are sick and wear a mask if you are sick. This information is not intended to replace advice given to you by your health care provider. Make sure you discuss any questions you have with your health care provider. Document Revised: 03/01/2019 Document Reviewed: 06/07/2018 [employer] Patient Education  2020 [employer]  

## 2019-06-21 DIAGNOSIS — Z20828 Contact with and (suspected) exposure to other viral communicable diseases: Secondary | ICD-10-CM | POA: Diagnosis not present

## 2019-06-25 ENCOUNTER — Encounter: Payer: Self-pay | Admitting: Internal Medicine

## 2019-07-23 ENCOUNTER — Encounter: Payer: Self-pay | Admitting: Nurse Practitioner

## 2019-07-29 DIAGNOSIS — F411 Generalized anxiety disorder: Secondary | ICD-10-CM | POA: Diagnosis not present

## 2019-07-29 DIAGNOSIS — F331 Major depressive disorder, recurrent, moderate: Secondary | ICD-10-CM | POA: Diagnosis not present

## 2019-07-30 DIAGNOSIS — Z23 Encounter for immunization: Secondary | ICD-10-CM | POA: Diagnosis not present

## 2019-08-07 ENCOUNTER — Encounter: Payer: Self-pay | Admitting: Internal Medicine

## 2019-08-07 ENCOUNTER — Other Ambulatory Visit: Payer: Self-pay

## 2019-08-07 ENCOUNTER — Ambulatory Visit: Payer: BC Managed Care – PPO | Admitting: Internal Medicine

## 2019-08-07 VITALS — BP 142/90 | HR 101 | Temp 98.5°F | Ht 62.4 in | Wt >= 6400 oz

## 2019-08-07 DIAGNOSIS — E66813 Obesity, class 3: Secondary | ICD-10-CM

## 2019-08-07 DIAGNOSIS — I10 Essential (primary) hypertension: Secondary | ICD-10-CM

## 2019-08-07 DIAGNOSIS — Z0000 Encounter for general adult medical examination without abnormal findings: Secondary | ICD-10-CM

## 2019-08-07 DIAGNOSIS — Z6841 Body Mass Index (BMI) 40.0 and over, adult: Secondary | ICD-10-CM

## 2019-08-07 LAB — POCT URINALYSIS DIPSTICK
Bilirubin, UA: NEGATIVE
Blood, UA: NEGATIVE
Glucose, UA: NEGATIVE
Ketones, UA: NEGATIVE
Leukocytes, UA: NEGATIVE
Nitrite, UA: NEGATIVE
Protein, UA: POSITIVE — AB
Spec Grav, UA: >=1.03 — AB (ref 1.010–1.025)
Urobilinogen, UA: 0.2 E.U./dL
pH, UA: 5.5 (ref 5.0–8.0)

## 2019-08-07 LAB — POCT UA - MICROALBUMIN
Albumin/Creatinine Ratio, Urine, POC: <30
Creatinine, POC: 300 mg/dL
Microalbumin Ur, POC: 30 mg/L

## 2019-08-07 MED ORDER — LISINOPRIL-HYDROCHLOROTHIAZIDE 10-12.5 MG PO TABS: 1.0000 | ORAL_TABLET | Freq: Every day | ORAL | 1 refills | Status: DC

## 2019-08-07 NOTE — Progress Notes (Signed)
This visit occurred during the SARS-CoV-2 public health emergency.  Safety protocols were in place, including screening questions prior to the visit, additional usage of staff PPE, and extensive cleaning of exam room while observing appropriate contact time as indicated for disinfecting solutions.  Subjective:     Patient ID: Sarah Barton , female    DOB: 09/09/1980 , 39 y.o.   MRN: [id number]   Chief Complaint  Patient presents with  . Annual Exam  . Hypertension    HPI  She is here today for a full physical exam.  She admits that she has not been seen by GYN in years. She is willing to get another referral. She admits that she has not been taking her bp medication.   Hypertension This is a chronic problem. The current episode started more than 1 year ago. The problem has been gradually improving since onset. The problem is controlled. Pertinent negatives include no blurred vision, chest pain, palpitations or shortness of breath. Past treatments include diuretics and ACE inhibitors. The current treatment provides moderate improvement. Compliance problems include exercise.      Past Medical History:  Diagnosis Date  . Angio-edema   . Depression   . Dysmetabolic syndrome   . Hypertension   . Hypothyroidism   . Morbid obesity ([place])   . Obesity, morbid ([place])   . Sciatica   . Thyroid disease   . Urticaria      Family History  Problem Relation Age of Onset  . Hypertension Mother   . Migraines Mother   . Anemia Mother   . Asthma Mother   . Cancer Maternal Grandfather        Colon  . Cancer Paternal Grandmother        Breast  . Diabetes Paternal Uncle   . Kidney disease Paternal Uncle   . Diabetes Paternal Aunt   . Migraines Maternal Aunt   . Sarcoidosis Father   . Gout Father   . Prostate cancer Father      Current Outpatient Medications:  .  albuterol (VENTOLIN HFA) 108 (90 Base) MCG/ACT inhaler, Inhale 2 puffs into the lungs every 6 (six) hours as needed for  wheezing or shortness of breath., Disp: 18 g, Rfl: 1 .  ALPRAZolam (XANAX) 1 MG tablet, , Disp: , Rfl:  .  EPINEPHrine 0.3 mg/0.3 mL IJ SOAJ injection, Inject 0.3 mLs (0.3 mg total) into the muscle as needed for anaphylaxis., Disp: 2 each, Rfl: 3 .  fluticasone (FLONASE) 50 MCG/ACT nasal spray, Place 1 spray into both nostrils daily., Disp: 16 g, Rfl: 5 .  omeprazole (PRILOSEC) 20 MG capsule, Take 20 mg by mouth daily., Disp: , Rfl:  .  sertraline (ZOLOFT) 100 MG tablet, Take 100 mg by mouth daily. 2 tabs daily, Disp: , Rfl:  .  SYNTHROID 137 MCG tablet, Take 1 tablet by mouth Monday - Sunday, Disp: 90 tablet, Rfl: 0 .  Vitamin D, Ergocalciferol, (DRISDOL) 1.25 MG (50000 UNIT) CAPS capsule, TAKE 1 CAPSULE TWICE WEEKLY ON TUESDAYS/FRIDAYS, Disp: 24 capsule, Rfl: 0 .  lisinopril-hydrochlorothiazide (ZESTORETIC) 10-12.5 MG tablet, Take 1 tablet by mouth daily. (Patient not taking: Reported on 08/07/2019), Disp: 90 tablet, Rfl: 1   No Known Allergies   Review of Systems  Constitutional: Negative.   HENT: Negative.   Eyes: Negative.  Negative for blurred vision.  Respiratory: Negative.  Negative for shortness of breath.   Cardiovascular: Negative.  Negative for chest pain and palpitations.  Endocrine: Negative.   Genitourinary: Negative.  Musculoskeletal: Negative.   Skin: Negative.   Allergic/Immunologic: Negative.   Neurological: Negative.   Hematological: Negative.   Psychiatric/Behavioral: Negative.      Today's Vitals   08/07/19 1110  BP: (!) 142/90  Pulse: (!) 101  Temp: 98.5 F (36.9 C)  TempSrc: Oral  Weight: (!) 442 lb (200.5 kg)  Height: 5' 2.4" (1.585 m)  PainSc: 9   PainLoc: Knee   Body mass index is 79.81 kg/m.   Objective:  Physical Exam Vitals and nursing note reviewed.  Constitutional:      Appearance: Normal appearance. She is obese.  HENT:     Head: Normocephalic and atraumatic.     Right Ear: Tympanic membrane, ear canal and external ear normal.      Left Ear: Tympanic membrane, ear canal and external ear normal.     Nose:     Comments: Deferred, masked    Mouth/Throat:     Comments: Deferred, masked Eyes:     Extraocular Movements: Extraocular movements intact.     Conjunctiva/sclera: Conjunctivae normal.     Pupils: Pupils are equal, round, and reactive to light.  Cardiovascular:     Rate and Rhythm: Normal rate and regular rhythm.     Pulses: Normal pulses.     Heart sounds: Normal heart sounds.  Pulmonary:     Effort: Pulmonary effort is normal.     Breath sounds: Normal breath sounds.  Chest:     Breasts: Tanner Score is 5.        Right: Normal.        Left: Normal.  Abdominal:     General: Bowel sounds are normal.     Palpations: Abdomen is soft.     Comments: Obese, soft. Difficult to assess organomegaly.   Genitourinary:    Comments: deferred Musculoskeletal:        General: Normal range of motion.     Cervical back: Normal range of motion and neck supple.  Skin:    General: Skin is warm and dry.  Neurological:     General: No focal deficit present.     Mental Status: She is alert and oriented to person, place, and time.  Psychiatric:        Mood and Affect: Mood normal.        Behavior: Behavior normal.         Assessment And Plan:     1. Routine general medical examination at health care facility  A full exam was performed. I will refer her to GYN as discussed. Importance of monthly self breast exams was discussed with the patient. PATIENT IS ADVISED TO GET 30-45 MINUTES REGULAR EXERCISE NO LESS THAN FOUR TO FIVE DAYS PER WEEK - BOTH WEIGHTBEARING EXERCISES AND AEROBIC ARE RECOMMENDED.  SHE IS ADVISED TO FOLLOW A HEALTHY DIET WITH AT LEAST SIX FRUITS/VEGGIES PER DAY, DECREASE INTAKE OF RED MEAT, AND TO INCREASE FISH INTAKE TO TWO DAYS PER WEEK.  MEATS/FISH SHOULD NOT BE FRIED, BAKED OR BROILED IS PREFERABLE.  I SUGGEST WEARING SPF 50 SUNSCREEN ON EXPOSED PARTS AND ESPECIALLY WHEN IN THE DIRECT SUNLIGHT FOR  AN EXTENDED PERIOD OF TIME.  PLEASE AVOID FAST FOOD RESTAURANTS AND INCREASE YOUR WATER INTAKE.  - Ambulatory referral to Gynecology - CMP14+EGFR - CBC - Lipid panel - Hemoglobin A1c - TSH - T4, Free  2. Essential hypertension, benign  Chronic, uncontrolled. I will resume lisinopril/hctz. She will rto in six weeks for re-evaluation. EKG performed, NSR w/ LAE.  She is  encouraged to avoid adding salt to her foods.   - POCT Urinalysis Dipstick (81002) - POCT UA - Microalbumin - EKG 12-Lead  3. Class 3 severe obesity due to excess calories with serious comorbidity and body mass index (BMI) greater than or equal to 70 in adult (HCC)  BMI 79. She agrees to consider surgical approaches to obesity. She was advised to contact [facility] to sign up for their orientation session. She is also encouraged to exercise 30 minutes five days weekly.   Maximino Greenland, MD    THE PATIENT IS ENCOURAGED TO PRACTICE SOCIAL DISTANCING DUE TO THE COVID-19 PANDEMIC.

## 2019-08-07 NOTE — Patient Instructions (Signed)
Health Maintenance, Female Adopting a healthy lifestyle and getting preventive care are important in promoting health and wellness. Ask your health care provider about:  The right schedule for you to have regular tests and exams.  Things you can do on your own to prevent diseases and keep yourself healthy. What should I know about diet, weight, and exercise? Eat a healthy diet   Eat a diet that includes plenty of vegetables, fruits, low-fat dairy products, and lean protein.  Do not eat a lot of foods that are high in solid fats, added sugars, or sodium. Maintain a healthy weight Body mass index (BMI) is used to identify weight problems. It estimates body fat based on height and weight. Your health care provider can help determine your BMI and help you achieve or maintain a healthy weight. Get regular exercise Get regular exercise. This is one of the most important things you can do for your health. Most adults should:  Exercise for at least 150 minutes each week. The exercise should increase your heart rate and make you sweat (moderate-intensity exercise).  Do strengthening exercises at least twice a week. This is in addition to the moderate-intensity exercise.  Spend less time sitting. Even light physical activity can be beneficial. Watch cholesterol and blood lipids Have your blood tested for lipids and cholesterol at 41 years of age, then have this test every 5 years. Have your cholesterol levels checked more often if:  Your lipid or cholesterol levels are high.  You are older than 40 years of age.  You are at high risk for heart disease. What should I know about cancer screening? Depending on your health history and family history, you may need to have cancer screening at various ages. This may include screening for:  Breast cancer.  Cervical cancer.  Colorectal cancer.  Skin cancer.  Lung cancer. What should I know about heart disease, diabetes, and high blood  pressure? Blood pressure and heart disease  High blood pressure causes heart disease and increases the risk of stroke. This is more likely to develop in people who have high blood pressure readings, are of African descent, or are overweight.  Have your blood pressure checked: ? Every 3-5 years if you are 18-39 years of age. ? Every year if you are 40 years old or older. Diabetes Have regular diabetes screenings. This checks your fasting blood sugar level. Have the screening done:  Once every three years after age 40 if you are at a normal weight and have a low risk for diabetes.  More often and at a younger age if you are overweight or have a high risk for diabetes. What should I know about preventing infection? Hepatitis B If you have a higher risk for hepatitis B, you should be screened for this virus. Talk with your health care provider to find out if you are at risk for hepatitis B infection. Hepatitis C Testing is recommended for:  Everyone born from 1945 through 1965.  Anyone with known risk factors for hepatitis C. Sexually transmitted infections (STIs)  Get screened for STIs, including gonorrhea and chlamydia, if: ? You are sexually active and are younger than 41 years of age. ? You are older than 41 years of age and your health care provider tells you that you are at risk for this type of infection. ? Your sexual activity has changed since you were last screened, and you are at increased risk for chlamydia or gonorrhea. Ask your health care provider if   you are at risk.  Ask your health care provider about whether you are at high risk for HIV. Your health care provider may recommend a prescription medicine to help prevent HIV infection. If you choose to take medicine to prevent HIV, you should first get tested for HIV. You should then be tested every 3 months for as long as you are taking the medicine. Pregnancy  If you are about to stop having your period (premenopausal) and  you may become pregnant, seek counseling before you get pregnant.  Take 400 to 800 micrograms (mcg) of folic acid every day if you become pregnant.  Ask for birth control (contraception) if you want to prevent pregnancy. Osteoporosis and menopause Osteoporosis is a disease in which the bones lose minerals and strength with aging. This can result in bone fractures. If you are 65 years old or older, or if you are at risk for osteoporosis and fractures, ask your health care provider if you should:  Be screened for bone loss.  Take a calcium or vitamin D supplement to lower your risk of fractures.  Be given hormone replacement therapy (HRT) to treat symptoms of menopause. Follow these instructions at home: Lifestyle  Do not use any products that contain nicotine or tobacco, such as cigarettes, e-cigarettes, and chewing tobacco. If you need help quitting, ask your health care provider.  Do not use street drugs.  Do not share needles.  Ask your health care provider for help if you need support or information about quitting drugs. Alcohol use  Do not drink alcohol if: ? Your health care provider tells you not to drink. ? You are pregnant, may be pregnant, or are planning to become pregnant.  If you drink alcohol: ? Limit how much you use to 0-1 drink a day. ? Limit intake if you are breastfeeding.  Be aware of how much alcohol is in your drink. In the [place] one drink equals one 12 oz bottle of beer (355 mL), one 5 oz glass of wine (148 mL), or one 1 oz glass of hard liquor (44 mL). General instructions  Schedule regular health, dental, and eye exams.  Stay current with your vaccines.  Tell your health care provider if: ? You often feel depressed. ? You have ever been abused or do not feel safe at home. Summary  Adopting a healthy lifestyle and getting preventive care are important in promoting health and wellness.  Follow your health care provider's instructions about healthy  diet, exercising, and getting tested or screened for diseases.  Follow your health care provider's instructions on monitoring your cholesterol and blood pressure. This information is not intended to replace advice given to you by your health care provider. Make sure you discuss any questions you have with your health care provider. Document Revised: 04/25/2018 Document Reviewed: 04/25/2018 Elsevier Patient Education  2020 [employer].  

## 2019-08-08 LAB — LIPID PANEL
Chol/HDL Ratio: 4.6 ratio — ABNORMAL HIGH (ref 0.0–4.4)
Cholesterol, Total: 228 mg/dL — ABNORMAL HIGH (ref 100–199)
HDL: 50 mg/dL (ref >39)
LDL Chol Calc (NIH): 160 mg/dL — ABNORMAL HIGH (ref 0–99)
Triglycerides: 99 mg/dL (ref 0–149)
VLDL Cholesterol Cal: 18 mg/dL (ref 5–40)

## 2019-08-08 LAB — CBC
Hematocrit: 43.8 % (ref 34.0–46.6)
Hemoglobin: 13.8 g/dL (ref 11.1–15.9)
MCH: 26.8 pg (ref 26.6–33.0)
MCHC: 31.5 g/dL (ref 31.5–35.7)
MCV: 85 fL (ref 79–97)
Platelets: 295 10*3/uL (ref 150–450)
RBC: 5.15 x10E6/uL (ref 3.77–5.28)
RDW: 14.6 % (ref 11.7–15.4)
WBC: 7 10*3/uL (ref 3.4–10.8)

## 2019-08-08 LAB — HEMOGLOBIN A1C
Est. average glucose Bld gHb Est-mCnc: 117 mg/dL
Hgb A1c MFr Bld: 5.7 % — ABNORMAL HIGH (ref 4.8–5.6)

## 2019-08-08 LAB — CMP14+EGFR
ALT: 14 IU/L (ref 0–32)
AST: 17 IU/L (ref 0–40)
Albumin/Globulin Ratio: 1.5 (ref 1.2–2.2)
Albumin: 4.4 g/dL (ref 3.8–4.8)
Alkaline Phosphatase: 71 IU/L (ref 39–117)
BUN/Creatinine Ratio: 16 (ref 9–23)
BUN: 13 mg/dL (ref 6–24)
Bilirubin Total: 0.7 mg/dL (ref 0.0–1.2)
CO2: 21 mmol/L (ref 20–29)
Calcium: 9.3 mg/dL (ref 8.7–10.2)
Chloride: 100 mmol/L (ref 96–106)
Creatinine, Ser: 0.82 mg/dL (ref 0.57–1.00)
GFR calc Af Amer: 103 mL/min/{1.73_m2} (ref >59)
GFR calc non Af Amer: 89 mL/min/{1.73_m2} (ref >59)
Globulin, Total: 2.9 g/dL (ref 1.5–4.5)
Glucose: 117 mg/dL — ABNORMAL HIGH (ref 65–99)
Potassium: 4.6 mmol/L (ref 3.5–5.2)
Sodium: 138 mmol/L (ref 134–144)
Total Protein: 7.3 g/dL (ref 6.0–8.5)

## 2019-08-08 LAB — TSH: TSH: 4.66 u[IU]/mL — ABNORMAL HIGH (ref 0.450–4.500)

## 2019-08-08 LAB — T4, FREE: Free T4: 1.5 ng/dL (ref 0.82–1.77)

## 2019-08-10 ENCOUNTER — Encounter: Payer: Self-pay | Admitting: Internal Medicine

## 2019-08-10 ENCOUNTER — Other Ambulatory Visit: Payer: Self-pay | Admitting: Internal Medicine

## 2019-08-10 MED ORDER — VITAMIN D (ERGOCALCIFEROL) 1.25 MG (50000 UNIT) PO CAPS: ORAL_CAPSULE | ORAL | 0 refills | Status: DC

## 2019-08-12 ENCOUNTER — Other Ambulatory Visit: Payer: Self-pay

## 2019-08-12 MED ORDER — ROSUVASTATIN CALCIUM 20 MG PO TABS: 20.0000 mg | ORAL_TABLET | Freq: Every day | ORAL | 1 refills | Status: DC

## 2019-08-15 ENCOUNTER — Telehealth: Payer: Self-pay

## 2019-08-15 NOTE — Telephone Encounter (Signed)
Dr.Sanders wants to know if the patient was using her inhaler more when she had covid and if she has been using her inhaler more than 2 times per week.

## 2019-08-22 ENCOUNTER — Ambulatory Visit: Payer: Managed Care, Other (non HMO) | Admitting: Allergy & Immunology

## 2019-08-26 ENCOUNTER — Other Ambulatory Visit: Payer: BC Managed Care – PPO

## 2019-08-26 ENCOUNTER — Ambulatory Visit: Payer: BC Managed Care – PPO

## 2019-08-27 DIAGNOSIS — Z23 Encounter for immunization: Secondary | ICD-10-CM | POA: Diagnosis not present

## 2019-08-30 ENCOUNTER — Encounter: Payer: Self-pay | Admitting: Internal Medicine

## 2019-09-07 ENCOUNTER — Other Ambulatory Visit: Payer: Self-pay | Admitting: Internal Medicine

## 2019-09-10 ENCOUNTER — Other Ambulatory Visit: Payer: BC Managed Care – PPO

## 2019-09-10 ENCOUNTER — Other Ambulatory Visit: Payer: Self-pay | Admitting: Internal Medicine

## 2019-09-10 ENCOUNTER — Other Ambulatory Visit: Payer: Self-pay

## 2019-09-10 ENCOUNTER — Ambulatory Visit: Payer: BC Managed Care – PPO

## 2019-09-10 VITALS — BP 136/88 | HR 98 | Temp 98.6°F | Ht 62.4 in | Wt >= 6400 oz

## 2019-09-10 DIAGNOSIS — I10 Essential (primary) hypertension: Secondary | ICD-10-CM

## 2019-09-10 DIAGNOSIS — Z79899 Other long term (current) drug therapy: Secondary | ICD-10-CM | POA: Diagnosis not present

## 2019-09-10 NOTE — Progress Notes (Signed)
Pt presents today for b/p check she is currently taking lisinopril hctz 10-12.5, in the morning today b/p 136/88  Provider aware Per RS pt should f/u with her in 2 months if b/p is not better she will increase her medication dose

## 2019-09-11 LAB — BMP8+EGFR
BUN/Creatinine Ratio: 12 (ref 9–23)
BUN: 9 mg/dL (ref 6–24)
CO2: 26 mmol/L (ref 20–29)
Calcium: 9.3 mg/dL (ref 8.7–10.2)
Chloride: 100 mmol/L (ref 96–106)
Creatinine, Ser: 0.77 mg/dL (ref 0.57–1.00)
GFR calc Af Amer: 111 mL/min/{1.73_m2} (ref >59)
GFR calc non Af Amer: 96 mL/min/{1.73_m2} (ref >59)
Glucose: 100 mg/dL — ABNORMAL HIGH (ref 65–99)
Potassium: 4.5 mmol/L (ref 3.5–5.2)
Sodium: 140 mmol/L (ref 134–144)

## 2019-09-18 ENCOUNTER — Encounter: Payer: Self-pay | Admitting: Internal Medicine

## 2019-09-18 DIAGNOSIS — Z1239 Encounter for other screening for malignant neoplasm of breast: Secondary | ICD-10-CM | POA: Diagnosis not present

## 2019-09-18 DIAGNOSIS — Z1231 Encounter for screening mammogram for malignant neoplasm of breast: Secondary | ICD-10-CM | POA: Diagnosis not present

## 2019-09-18 DIAGNOSIS — N926 Irregular menstruation, unspecified: Secondary | ICD-10-CM | POA: Diagnosis not present

## 2019-09-18 DIAGNOSIS — Z6841 Body Mass Index (BMI) 40.0 and over, adult: Secondary | ICD-10-CM | POA: Diagnosis not present

## 2019-09-18 DIAGNOSIS — Z124 Encounter for screening for malignant neoplasm of cervix: Secondary | ICD-10-CM | POA: Diagnosis not present

## 2019-09-18 DIAGNOSIS — Z01419 Encounter for gynecological examination (general) (routine) without abnormal findings: Secondary | ICD-10-CM | POA: Diagnosis not present

## 2019-09-18 LAB — HM MAMMOGRAPHY

## 2019-09-23 ENCOUNTER — Other Ambulatory Visit: Payer: Self-pay

## 2019-09-23 ENCOUNTER — Encounter: Payer: Self-pay | Admitting: Internal Medicine

## 2019-09-23 ENCOUNTER — Ambulatory Visit: Payer: BC Managed Care – PPO | Admitting: Internal Medicine

## 2019-09-23 VITALS — BP 132/80 | HR 94 | Temp 98.3°F | Ht 62.8 in | Wt >= 6400 oz

## 2019-09-23 DIAGNOSIS — Z6841 Body Mass Index (BMI) 40.0 and over, adult: Secondary | ICD-10-CM

## 2019-09-23 DIAGNOSIS — Z79899 Other long term (current) drug therapy: Secondary | ICD-10-CM | POA: Diagnosis not present

## 2019-09-23 DIAGNOSIS — M1712 Unilateral primary osteoarthritis, left knee: Secondary | ICD-10-CM | POA: Diagnosis not present

## 2019-09-23 DIAGNOSIS — I10 Essential (primary) hypertension: Secondary | ICD-10-CM | POA: Diagnosis not present

## 2019-09-23 DIAGNOSIS — E7800 Pure hypercholesterolemia, unspecified: Secondary | ICD-10-CM | POA: Diagnosis not present

## 2019-09-23 NOTE — Progress Notes (Signed)
This visit occurred during the SARS-CoV-2 public health emergency.  Safety protocols were in place, including screening questions prior to the visit, additional usage of staff PPE, and extensive cleaning of exam room while observing appropriate contact time as indicated for disinfecting solutions.  Subjective:     Patient ID: Sarah Barton , female    DOB: 29-Jan-1981 , 39 y.o.   MRN: [id number]   Chief Complaint  Patient presents with  . Hyperlipidemia    HPI  She is here today for cholesterol check. She was started on rosuvastatin at her last viist. She has not had any issues with the medication.  She is not yet exercising regularly b/c knee pain.     Past Medical History:  Diagnosis Date  . Angio-edema   . Depression   . Dysmetabolic syndrome   . Hypertension   . Hypothyroidism   . Morbid obesity ([place])   . Obesity, morbid ([place])   . Sciatica   . Thyroid disease   . Urticaria      Family History  Problem Relation Age of Onset  . Hypertension Mother   . Migraines Mother   . Anemia Mother   . Asthma Mother   . Cancer Maternal Grandfather        Colon  . Cancer Paternal Grandmother        Breast  . Diabetes Paternal Uncle   . Kidney disease Paternal Uncle   . Diabetes Paternal Aunt   . Migraines Maternal Aunt   . Sarcoidosis Father   . Gout Father   . Prostate cancer Father      Current Outpatient Medications:  .  albuterol (VENTOLIN HFA) 108 (90 Base) MCG/ACT inhaler, Inhale 2 puffs into the lungs every 6 (six) hours as needed for wheezing or shortness of breath., Disp: 18 g, Rfl: 1 .  ALPRAZolam (XANAX) 1 MG tablet, , Disp: , Rfl:  .  EPINEPHrine 0.3 mg/0.3 mL IJ SOAJ injection, Inject 0.3 mLs (0.3 mg total) into the muscle as needed for anaphylaxis., Disp: 2 each, Rfl: 3 .  fluticasone (FLONASE) 50 MCG/ACT nasal spray, Place 1 spray into both nostrils daily., Disp: 16 g, Rfl: 5 .  lisinopril-hydrochlorothiazide (ZESTORETIC) 10-12.5 MG tablet, Take 1 tablet by  mouth daily., Disp: 90 tablet, Rfl: 1 .  omeprazole (PRILOSEC) 20 MG capsule, Take 20 mg by mouth daily., Disp: , Rfl:  .  rosuvastatin (CRESTOR) 20 MG tablet, TAKE 1 TABLET BY MOUTH EVERYDAY AT BEDTIME, Disp: 30 tablet, Rfl: 1 .  sertraline (ZOLOFT) 100 MG tablet, Take 100 mg by mouth daily. 2 tabs daily, Disp: , Rfl:  .  SYNTHROID 137 MCG tablet, Take 1 tablet by mouth Monday - Sunday, Disp: 90 tablet, Rfl: 0 .  Vitamin D, Ergocalciferol, (DRISDOL) 1.25 MG (50000 UNIT) CAPS capsule, TAKE 1 CAPSULE TWICE WEEKLY ON TUESDAYS/FRIDAYS, Disp: 24 capsule, Rfl: 0   No Known Allergies   Review of Systems  Constitutional: Negative.   Respiratory: Negative.   Cardiovascular: Negative.   Gastrointestinal: Negative.   Musculoskeletal: Positive for arthralgias.       She c/o left knee pain. There is some pain with ambulation. She denies fall/trauma. Has h/o OA. Pain is becoming unbearable.   Neurological: Negative.   Psychiatric/Behavioral: Negative.      Today's Vitals   09/23/19 1506  BP: 132/80  Pulse: 94  Temp: 98.3 F (36.8 C)  TempSrc: Oral  Weight: (!) 447 lb 12.8 oz (203.1 kg)  Height: 5' 2.8" (1.595 m)  Body mass index is 79.83 kg/m.   Objective:  Physical Exam Vitals and nursing note reviewed.  Constitutional:      Appearance: Normal appearance.  HENT:     Head: Normocephalic and atraumatic.  Cardiovascular:     Rate and Rhythm: Normal rate and regular rhythm.     Heart sounds: Normal heart sounds.  Pulmonary:     Effort: Pulmonary effort is normal.     Breath sounds: Normal breath sounds.  Skin:    General: Skin is warm.  Neurological:     General: No focal deficit present.     Mental Status: She is alert.  Psychiatric:        Mood and Affect: Mood normal.        Behavior: Behavior normal.         Assessment And Plan:     1. Pure hypercholesterolemia  Chronic. I will check lipid panel. I will make further recommendations once her labs are available for  review. We discussed high cholesterol being part of metabolic syndrome. I will also check insulin level. She may benefit from GLP-1 therapy, this could also address obesity. She denies family h/o thyroid cancer.   - Insulin, random(561) - Lipid panel  2. Essential hypertension, benign  Chronic, fair control. She is aware BP goal is less than 130/80. She is encouraged to incorporate more exercise into her daily routine.   3. Primary osteoarthritis of left knee  Chronic, likely exacerbated by her excess weight. Already followed by Ortho. Advised to apply topical pain cream on front/sides/back of knee two to three times daily as needed.   4. Drug therapy  - Liver Profile  5. Class 3 severe obesity due to excess calories with serious comorbidity and body mass index (BMI) greater than or equal to 70 in adult [facility])  She agrees that weight loss is needed to decrease her risk of chronic illnesses including heart disease. She agrees to referral to [facility] Bariatric program. She prefers [place] location.   - Ambulatory referral to Internal Medicine     Gwynneth Aliment, MD    THE PATIENT IS ENCOURAGED TO PRACTICE SOCIAL DISTANCING DUE TO THE COVID-19 PANDEMIC.

## 2019-09-24 ENCOUNTER — Encounter: Payer: Self-pay | Admitting: Internal Medicine

## 2019-09-24 LAB — HEPATIC FUNCTION PANEL
ALT: 15 IU/L (ref 0–32)
AST: 15 IU/L (ref 0–40)
Albumin: 4.7 g/dL (ref 3.8–4.8)
Alkaline Phosphatase: 75 IU/L (ref 39–117)
Bilirubin Total: 0.4 mg/dL (ref 0.0–1.2)
Bilirubin, Direct: 0.15 mg/dL (ref 0.00–0.40)
Total Protein: 7.7 g/dL (ref 6.0–8.5)

## 2019-09-24 LAB — LIPID PANEL
Chol/HDL Ratio: 3 ratio (ref 0.0–4.4)
Cholesterol, Total: 193 mg/dL (ref 100–199)
HDL: 64 mg/dL (ref >39)
LDL Chol Calc (NIH): 114 mg/dL — ABNORMAL HIGH (ref 0–99)
Triglycerides: 82 mg/dL (ref 0–149)
VLDL Cholesterol Cal: 15 mg/dL (ref 5–40)

## 2019-09-24 LAB — INSULIN, RANDOM: INSULIN: 24.6 u[IU]/mL (ref 2.6–24.9)

## 2019-09-24 LAB — HM PAP SMEAR

## 2019-10-05 NOTE — Patient Instructions (Signed)

## 2019-10-14 ENCOUNTER — Other Ambulatory Visit: Payer: Self-pay | Admitting: Internal Medicine

## 2019-10-22 ENCOUNTER — Other Ambulatory Visit: Payer: Self-pay

## 2019-10-22 ENCOUNTER — Ambulatory Visit: Payer: BC Managed Care – PPO

## 2019-10-22 VITALS — BP 132/84 | HR 100 | Temp 98.1°F | Ht 62.8 in | Wt >= 6400 oz

## 2019-10-22 DIAGNOSIS — Z6841 Body Mass Index (BMI) 40.0 and over, adult: Secondary | ICD-10-CM

## 2019-10-22 NOTE — Progress Notes (Signed)
Pt presents today for a weight check 442.8lb

## 2019-10-31 ENCOUNTER — Other Ambulatory Visit: Payer: Self-pay | Admitting: Internal Medicine

## 2019-12-03 DIAGNOSIS — F331 Major depressive disorder, recurrent, moderate: Secondary | ICD-10-CM | POA: Diagnosis not present

## 2019-12-03 DIAGNOSIS — F411 Generalized anxiety disorder: Secondary | ICD-10-CM | POA: Diagnosis not present

## 2019-12-14 ENCOUNTER — Other Ambulatory Visit: Payer: Self-pay | Admitting: Internal Medicine

## 2020-02-06 ENCOUNTER — Ambulatory Visit: Payer: BC Managed Care – PPO | Admitting: Internal Medicine

## 2020-02-11 ENCOUNTER — Ambulatory Visit: Payer: BC Managed Care – PPO | Admitting: Internal Medicine

## 2020-02-11 ENCOUNTER — Encounter: Payer: Self-pay | Admitting: Internal Medicine

## 2020-02-11 ENCOUNTER — Other Ambulatory Visit: Payer: Self-pay

## 2020-02-11 ENCOUNTER — Ambulatory Visit (INDEPENDENT_AMBULATORY_CARE_PROVIDER_SITE_OTHER): Payer: BC Managed Care – PPO | Admitting: Internal Medicine

## 2020-02-11 VITALS — BP 122/76 | HR 92 | Temp 98.0°F | Ht 62.8 in | Wt >= 6400 oz

## 2020-02-11 DIAGNOSIS — I10 Essential (primary) hypertension: Secondary | ICD-10-CM

## 2020-02-11 DIAGNOSIS — Z6841 Body Mass Index (BMI) 40.0 and over, adult: Secondary | ICD-10-CM

## 2020-02-11 DIAGNOSIS — Z1159 Encounter for screening for other viral diseases: Secondary | ICD-10-CM | POA: Diagnosis not present

## 2020-02-11 DIAGNOSIS — E039 Hypothyroidism, unspecified: Secondary | ICD-10-CM | POA: Diagnosis not present

## 2020-02-11 DIAGNOSIS — Z23 Encounter for immunization: Secondary | ICD-10-CM | POA: Diagnosis not present

## 2020-02-11 NOTE — Progress Notes (Signed)
I,Katawbba Wiggins,acting as a Education administrator for Maximino Greenland, MD.,have documented all relevant documentation on the behalf of Maximino Greenland, MD,as directed by  Maximino Greenland, MD while in the presence of Maximino Greenland, MD.  This visit occurred during the SARS-CoV-2 public health emergency.  Safety protocols were in place, including screening questions prior to the visit, additional usage of staff PPE, and extensive cleaning of exam room while observing appropriate contact time as indicated for disinfecting solutions.  Subjective:     Patient ID: Sarah Barton , female    DOB: 07/07/1980 , 39 y.o.   MRN: [id number]   Chief Complaint  Patient presents with  . Hypertension  . Hypothyroidism    HPI  The patient is here today for a blood pressure and thyroid follow-up.  She reports compliance with medications. However, admits that she sometimes skips BP meds b/c of frequent urination. Unable to get frequent bathroom breaks while at work.   Hypertension This is a chronic problem. The current episode started more than 1 year ago. The problem has been gradually improving since onset. The problem is controlled. Pertinent negatives include no blurred vision, chest pain, orthopnea or shortness of breath. Risk factors for coronary artery disease include obesity and sedentary lifestyle. Past treatments include ACE inhibitors and diuretics. The current treatment provides moderate improvement. Compliance problems include exercise.      Past Medical History:  Diagnosis Date  . Angio-edema   . Depression   . Dysmetabolic syndrome   . Hypertension   . Hypothyroidism   . Morbid obesity ([place])   . Obesity, morbid ([place])   . Sciatica   . Thyroid disease   . Urticaria      Family History  Problem Relation Age of Onset  . Hypertension Mother   . Migraines Mother   . Anemia Mother   . Asthma Mother   . Cancer Maternal Grandfather        Colon  . Cancer Paternal Grandmother        Breast  .  Diabetes Paternal Uncle   . Kidney disease Paternal Uncle   . Diabetes Paternal Aunt   . Migraines Maternal Aunt   . Sarcoidosis Father   . Gout Father   . Prostate cancer Father      Current Outpatient Medications:  .  albuterol (VENTOLIN HFA) 108 (90 Base) MCG/ACT inhaler, Inhale 2 puffs into the lungs every 6 (six) hours as needed for wheezing or shortness of breath., Disp: 18 g, Rfl: 1 .  ALPRAZolam (XANAX) 1 MG tablet, , Disp: , Rfl:  .  EPINEPHrine 0.3 mg/0.3 mL IJ SOAJ injection, Inject 0.3 mLs (0.3 mg total) into the muscle as needed for anaphylaxis., Disp: 2 each, Rfl: 3 .  lisinopril-hydrochlorothiazide (ZESTORETIC) 10-12.5 MG tablet, Take 1 tablet by mouth daily., Disp: 90 tablet, Rfl: 1 .  rosuvastatin (CRESTOR) 20 MG tablet, TAKE 1 TABLET BY MOUTH EVERYDAY AT BEDTIME, Disp: 30 tablet, Rfl: 3 .  sertraline (ZOLOFT) 100 MG tablet, Take 100 mg by mouth daily. 2 tabs daily, Disp: , Rfl:  .  SYNTHROID 137 MCG tablet, TAKE 1 TABLET BY MOUTH DAILY, Disp: 90 tablet, Rfl: 0 .  Vitamin D, Ergocalciferol, (DRISDOL) 1.25 MG (50000 UNIT) CAPS capsule, TAKE 1 CAPSULE TWICE WEEKLY ON TUESDAYS/FRIDAYS, Disp: 24 capsule, Rfl: 0 .  fluticasone (FLONASE) 50 MCG/ACT nasal spray, Place 1 spray into both nostrils daily. (Patient not taking: Reported on 02/11/2020), Disp: 16 g, Rfl: 5 .  omeprazole (PRILOSEC)  20 MG capsule, Take 20 mg by mouth daily. (Patient not taking: Reported on 02/11/2020), Disp: , Rfl:    No Known Allergies   Review of Systems  Constitutional: Negative.   Eyes: Negative for blurred vision.  Respiratory: Negative.  Negative for shortness of breath.   Cardiovascular: Negative.  Negative for chest pain and orthopnea.  Gastrointestinal: Negative.   Psychiatric/Behavioral: Negative.   All other systems reviewed and are negative.    Today's Vitals   02/11/20 1557  BP: 122/76  Pulse: 92  Temp: 98 F (36.7 C)  TempSrc: Oral  Weight: (!) 460 lb 9.6 oz (208.9 kg)  Height:  5' 2.8" (1.595 m)   Body mass index is 82.11 kg/m.   Objective:  Physical Exam Vitals and nursing note reviewed.  Constitutional:      Appearance: Normal appearance. She is obese.  HENT:     Head: Normocephalic and atraumatic.  Cardiovascular:     Rate and Rhythm: Normal rate and regular rhythm.     Heart sounds: Normal heart sounds.  Pulmonary:     Breath sounds: Normal breath sounds.  Skin:    General: Skin is warm.  Neurological:     General: No focal deficit present.     Mental Status: She is alert and oriented to person, place, and time.         Assessment And Plan:     1. Essential hypertension, benign Comments: Chronic, well controlled. She will continue with current meds. She is encouraged to avoid adding salt to her foods. Importance of medication/dietary/exercise compliance was discussed with the patient.  - BMP8+EGFR - Insulin, random(561)  2. Primary hypothyroidism Comments: I will check thyroid panel and adjust meds as needed.  - TSH - T4, free  3. Need for hepatitis C screening test Comments: I will check hepatitis c antibody today.  - Hepatitis C antibody  4. Need for vaccination Comments: She was given flu vaccine to update her immunization history.  - Flu Vaccine QUAD 6+ mos PF IM (Fluarix Quad PF)  5. Class 3 severe obesity due to excess calories with serious comorbidity and body mass index (BMI) greater than or equal to 70 in adult (HCC)  BMI 82. She was referred to [facility] program at previous visit, she only just recently completed virtual orientation. I will also resume Ozempic 0.56m once weekly x 2 weeks, then 0.537monce weekly. [facility] is no family history of thyroid cancer. She is in agreement with her treatment plan.    Patient was given opportunity to ask questions. Patient verbalized understanding of the plan and was able to repeat key elements of the plan. All questions were answered to  their satisfaction.  RoMaximino GreenlandMD   I, RoMaximino GreenlandMD, have reviewed all documentation for this visit. The documentation on 02/11/20 for the exam, diagnosis, procedures, and orders are all accurate and complete.  THE PATIENT IS ENCOURAGED TO PRACTICE SOCIAL DISTANCING DUE TO THE COVID-19 PANDEMIC.

## 2020-02-12 LAB — BMP8+EGFR
BUN/Creatinine Ratio: 11 (ref 9–23)
BUN: 9 mg/dL (ref 6–24)
CO2: 24 mmol/L (ref 20–29)
Calcium: 9.7 mg/dL (ref 8.7–10.2)
Chloride: 99 mmol/L (ref 96–106)
Creatinine, Ser: 0.8 mg/dL (ref 0.57–1.00)
GFR calc Af Amer: 106 mL/min/{1.73_m2} (ref >59)
GFR calc non Af Amer: 92 mL/min/{1.73_m2} (ref >59)
Glucose: 97 mg/dL (ref 65–99)
Potassium: 4.2 mmol/L (ref 3.5–5.2)
Sodium: 139 mmol/L (ref 134–144)

## 2020-02-12 LAB — HEPATITIS C ANTIBODY: Hep C Virus Ab: <0.1 s/co ratio (ref 0.0–0.9)

## 2020-02-12 LAB — TSH: TSH: 6.13 u[IU]/mL — ABNORMAL HIGH (ref 0.450–4.500)

## 2020-02-12 LAB — INSULIN, RANDOM: INSULIN: 41.4 u[IU]/mL — ABNORMAL HIGH (ref 2.6–24.9)

## 2020-02-12 LAB — T4, FREE: Free T4: 1.47 ng/dL (ref 0.82–1.77)

## 2020-02-14 ENCOUNTER — Encounter: Payer: Self-pay | Admitting: Internal Medicine

## 2020-02-17 ENCOUNTER — Encounter: Payer: Self-pay | Admitting: Internal Medicine

## 2020-02-17 ENCOUNTER — Other Ambulatory Visit: Payer: Self-pay

## 2020-02-17 MED ORDER — LEVOTHYROXINE SODIUM 150 MCG PO TABS
ORAL_TABLET | ORAL | 11 refills | Status: DC
Start: 2020-02-17 — End: 2021-03-15

## 2020-02-27 DIAGNOSIS — F331 Major depressive disorder, recurrent, moderate: Secondary | ICD-10-CM | POA: Diagnosis not present

## 2020-02-27 DIAGNOSIS — F411 Generalized anxiety disorder: Secondary | ICD-10-CM | POA: Diagnosis not present

## 2020-03-18 ENCOUNTER — Other Ambulatory Visit: Payer: Self-pay | Admitting: Internal Medicine

## 2020-03-27 ENCOUNTER — Other Ambulatory Visit: Payer: Self-pay | Admitting: Internal Medicine

## 2020-03-28 ENCOUNTER — Other Ambulatory Visit: Payer: Self-pay | Admitting: Internal Medicine

## 2020-04-06 ENCOUNTER — Ambulatory Visit: Payer: BC Managed Care – PPO | Admitting: Internal Medicine

## 2020-05-25 ENCOUNTER — Other Ambulatory Visit: Payer: Self-pay

## 2020-05-25 ENCOUNTER — Ambulatory Visit: Payer: BC Managed Care – PPO | Admitting: Internal Medicine

## 2020-05-25 ENCOUNTER — Encounter: Payer: Self-pay | Admitting: Internal Medicine

## 2020-05-25 VITALS — BP 140/86 | HR 95 | Temp 98.0°F | Ht 62.8 in | Wt >= 6400 oz

## 2020-05-25 DIAGNOSIS — E039 Hypothyroidism, unspecified: Secondary | ICD-10-CM | POA: Diagnosis not present

## 2020-05-25 DIAGNOSIS — F331 Major depressive disorder, recurrent, moderate: Secondary | ICD-10-CM | POA: Diagnosis not present

## 2020-05-25 DIAGNOSIS — F411 Generalized anxiety disorder: Secondary | ICD-10-CM | POA: Diagnosis not present

## 2020-05-25 DIAGNOSIS — Z6841 Body Mass Index (BMI) 40.0 and over, adult: Secondary | ICD-10-CM

## 2020-05-25 DIAGNOSIS — I10 Essential (primary) hypertension: Secondary | ICD-10-CM

## 2020-05-25 DIAGNOSIS — E8881 Metabolic syndrome: Secondary | ICD-10-CM

## 2020-05-25 NOTE — Patient Instructions (Addendum)
Ozempic 0.25mg  x 2 weeks, then 0.5mg  weekly   Exercising to Lose Weight Exercise is structured, repetitive physical activity to improve fitness and health. Getting regular exercise is important for everyone. It is especially important if you are overweight. Being overweight increases your risk of heart disease, stroke, diabetes, high blood pressure, and several types of cancer. Reducing your calorie intake and exercising can help you lose weight. Exercise is usually categorized as moderate or vigorous intensity. To lose weight, most people need to do a certain amount of moderate-intensity or vigorous-intensity exercise each week. Moderate-intensity exercise Moderate-intensity exercise is any activity that gets you moving enough to burn at least three times more energy (calories) than if you were sitting. Examples of moderate exercise include:  Walking a mile in 15 minutes.  Doing light yard work.  Biking at an easy pace. Most people should get at least 150 minutes (2 hours and 30 minutes) a week of moderate-intensity exercise to maintain their body weight.   Vigorous-intensity exercise Vigorous-intensity exercise is any activity that gets you moving enough to burn at least six times more calories than if you were sitting. When you exercise at this intensity, you should be working hard enough that you are not able to carry on a conversation. Examples of vigorous exercise include:  Running.  Playing a team sport, such as football, basketball, and soccer.  Jumping rope. Most people should get at least 75 minutes (1 hour and 15 minutes) a week of vigorous-intensity exercise to maintain their body weight. How can exercise affect me? When you exercise enough to burn more calories than you eat, you lose weight. Exercise also reduces body fat and builds muscle. The more muscle you have, the more calories you burn. Exercise also:  Improves mood.  Reduces stress and tension.  Improves your  overall fitness, flexibility, and endurance.  Increases bone strength. The amount of exercise you need to lose weight depends on:  Your age.  The type of exercise.  Any health conditions you have.  Your overall physical ability. Talk to your health care provider about how much exercise you need and what types of activities are safe for you. What actions can I take to lose weight? Nutrition  Make changes to your diet as told by your health care provider or diet and nutrition specialist (dietitian). This may include: ? Eating fewer calories. ? Eating more protein. ? Eating less unhealthy fats. ? Eating a diet that includes fresh fruits and vegetables, whole grains, low-fat dairy products, and lean protein. ? Avoiding foods with added fat, salt, and sugar.  Drink plenty of water while you exercise to prevent dehydration or heat stroke.   Activity  Choose an activity that you enjoy and set realistic goals. Your health care provider can help you make an exercise plan that works for you.  Exercise at a moderate or vigorous intensity most days of the week. ? The intensity of exercise may vary from person to person. You can tell how intense a workout is for you by paying attention to your breathing and heartbeat. Most people will notice their breathing and heartbeat get faster with more intense exercise.  Do resistance training twice each week, such as: ? Push-ups. ? Sit-ups. ? Lifting weights. ? Using resistance bands.  Getting short amounts of exercise can be just as helpful as long structured periods of exercise. If you have trouble finding time to exercise, try to include exercise in your daily routine. ? Get up, stretch, and  walk around every 30 minutes throughout the day. ? Go for a walk during your lunch break. ? Park your car farther away from your destination. ? If you take public transportation, get off one stop early and walk the rest of the way. ? Make phone calls while  standing up and walking around. ? Take the stairs instead of elevators or escalators.  Wear comfortable clothes and shoes with good support.  Do not exercise so much that you hurt yourself, feel dizzy, or get very short of breath. Where to find more information  U.S. Department of Health and Human Services: ThisPath.fi  Centers for Disease Control and Prevention (CDC): FootballExhibition.com.br Contact a health care provider:  Before starting a new exercise program.  If you have questions or concerns about your weight.  If you have a medical problem that keeps you from exercising. Get help right away if you have any of the following while exercising:  Injury.  Dizziness.  Difficulty breathing or shortness of breath that does not go away when you stop exercising.  Chest pain.  Rapid heartbeat. Summary  Being overweight increases your risk of heart disease, stroke, diabetes, high blood pressure, and several types of cancer.  Losing weight happens when you burn more calories than you eat.  Reducing the amount of calories you eat in addition to getting regular moderate or vigorous exercise each week helps you lose weight. This information is not intended to replace advice given to you by your health care provider. Make sure you discuss any questions you have with your health care provider. Document Revised: 08/29/2019 Document Reviewed: 08/29/2019 Elsevier Patient Education  2021 [employer].

## 2020-05-25 NOTE — Progress Notes (Signed)
I,Katawbba Wiggins,acting as a Education administrator for Maximino Greenland, MD.,have documented all relevant documentation on the behalf of Maximino Greenland, MD,as directed by  Maximino Greenland, MD while in the presence of Maximino Greenland, MD.  This visit occurred during the SARS-CoV-2 public health emergency.  Safety protocols were in place, including screening questions prior to the visit, additional usage of staff PPE, and extensive cleaning of exam room while observing appropriate contact time as indicated for disinfecting solutions.  Subjective:     Patient ID: Sarah Barton , female    DOB: [birth year] , 40 y.o.   MRN: [id number]   Chief Complaint  Patient presents with  . Hypertension  . Hypothyroidism    HPI  The patient is here today for a blood pressure and thyroid follow-up.  She admits that she could improve compliance with meds.   Hypertension This is a chronic problem. The current episode started more than 1 year ago. The problem has been gradually improving since onset. The problem is controlled. Pertinent negatives include no blurred vision, chest pain, orthopnea or shortness of breath. Risk factors for coronary artery disease include obesity and sedentary lifestyle. Past treatments include ACE inhibitors and diuretics. The current treatment provides moderate improvement. Compliance problems include exercise.      Past Medical History:  Diagnosis Date  . Angio-edema   . Depression   . Dysmetabolic syndrome   . Hypertension   . Hypothyroidism   . Morbid obesity ([place])   . Obesity, morbid ([place])   . Sciatica   . Thyroid disease   . Urticaria      Family History  Problem Relation Age of Onset  . Hypertension Mother   . Migraines Mother   . Anemia Mother   . Asthma Mother   . Cancer Maternal Grandfather        Colon  . Cancer Paternal Grandmother        Breast  . Diabetes Paternal Uncle   . Kidney disease Paternal Uncle   . Diabetes Paternal Aunt   . Migraines Maternal Aunt    . Sarcoidosis Father   . Gout Father   . Prostate cancer Father      Current Outpatient Medications:  .  albuterol (VENTOLIN HFA) 108 (90 Base) MCG/ACT inhaler, Inhale 2 puffs into the lungs every 6 (six) hours as needed for wheezing or shortness of breath., Disp: 18 g, Rfl: 1 .  ALPRAZolam (XANAX) 1 MG tablet, , Disp: , Rfl:  .  EPINEPHrine 0.3 mg/0.3 mL IJ SOAJ injection, Inject 0.3 mLs (0.3 mg total) into the muscle as needed for anaphylaxis., Disp: 2 each, Rfl: 3 .  levothyroxine (SYNTHROID) 150 MCG tablet, Take one tablet by mouth Monday- Friday. Take 137MCG Saturday and Sunday., Disp: 30 tablet, Rfl: 11 .  lisinopril-hydrochlorothiazide (ZESTORETIC) 10-12.5 MG tablet, TAKE 1 TABLET BY MOUTH EVERY DAY (Patient taking differently: Sometimes), Disp: 90 tablet, Rfl: 1 .  SYNTHROID 137 MCG tablet, TAKE 1 TABLET BY MOUTH DAILY (Patient taking differently: TAKE 1 TABLET BY MOUTH Saturday - Sunday), Disp: 90 tablet, Rfl: 0 .  fluticasone (FLONASE) 50 MCG/ACT nasal spray, Place 1 spray into both nostrils daily. (Patient not taking: Reported on 05/25/2020), Disp: 16 g, Rfl: 5 .  omeprazole (PRILOSEC) 20 MG capsule, Take 20 mg by mouth daily. (Patient not taking: No sig reported), Disp: , Rfl:  .  rosuvastatin (CRESTOR) 20 MG tablet, TAKE 1 TABLET BY MOUTH EVERYDAY AT BEDTIME (Patient not taking: Reported on 05/25/2020), Disp:  30 tablet, Rfl: 3 .  sertraline (ZOLOFT) 100 MG tablet, Take 100 mg by mouth daily. 2 tabs daily, Disp: , Rfl:  .  Vitamin D, Ergocalciferol, (DRISDOL) 1.25 MG (50000 UNIT) CAPS capsule, TAKE 1 CAPSULE TWICE WEEKLY ON TUESDAYS/FRIDAYS, Disp: 24 capsule, Rfl: 0   No Known Allergies   Review of Systems  Constitutional: Negative.   Eyes: Negative for blurred vision.  Respiratory: Negative.  Negative for shortness of breath.   Cardiovascular: Negative.  Negative for chest pain and orthopnea.  Gastrointestinal: Negative.   Psychiatric/Behavioral: Negative.   All other systems  reviewed and are negative.    Today's Vitals   05/25/20 1547  BP: 140/86  Pulse: 95  Temp: 98 F (36.7 C)  TempSrc: Oral  Weight: (!) 462 lb 3.2 oz (209.7 kg)  Height: 5' 2.8" (1.595 m)   Body mass index is 82.4 kg/m.  Wt Readings from Last 3 Encounters:  05/25/20 (!) 462 lb 3.2 oz (209.7 kg)  02/11/20 (!) 460 lb 9.6 oz (208.9 kg)  10/22/19 (!) 442 lb 12.8 oz (200.9 kg)   Objective:  Physical Exam Vitals and nursing note reviewed.  Constitutional:      Appearance: Normal appearance. She is obese.  HENT:     Head: Normocephalic and atraumatic.  Cardiovascular:     Rate and Rhythm: Normal rate and regular rhythm.     Heart sounds: Normal heart sounds.  Pulmonary:     Breath sounds: Normal breath sounds.  Musculoskeletal:     Cervical back: Normal range of motion.  Skin:    General: Skin is warm.  Neurological:     General: No focal deficit present.     Mental Status: She is alert and oriented to person, place, and time.         Assessment And Plan:     1. Essential hypertension, benign Comments: Chronic, fair control. She will continue wtih lisinopril/hctz for now, admits to non-compliance.  She is also advised to follow a low sodium diet.  - CMP14+EGFR  2. Primary hypothyroidism Comments: I will check thyroid panel and adjust meds as needed. - TSH - T4, free  3. Insulin resistance Comments: We discussed the use of GLP-1 agonists to address this issue. She denies personal/family history of thyroid cancer. She does not mind using injectable devices if needed. She is encouraged to avoid sugary beverages, including diet drinks.   4. Class 3 severe obesity due to excess calories with serious comorbidity and body mass index (BMI) greater than or equal to 70 in adult [facility]) Comments: She agrees to referral to [facility] program. Pt advised there is a [place] location. The importance of regular exercise was discussed with the patient and she is encouraged to aim  for at least 150 minutes of exercise per week. Advised to start with 10 -15 minutes four to five days per week and gradually work up to 30-45 minutes five to six days per week.  - Ambulatory referral to Internal Medicine  Patient was given opportunity to ask questions. Patient verbalized understanding of the plan and was able to repeat key elements of the plan. All questions were answered to their satisfaction.  Maximino Greenland, MD   I, Maximino Greenland, MD, have reviewed all documentation for this visit. The documentation on 06/07/20 for the exam, diagnosis, procedures, and orders are all accurate and complete.  THE PATIENT IS ENCOURAGED TO PRACTICE SOCIAL DISTANCING DUE TO THE COVID-19 PANDEMIC.

## 2020-05-26 LAB — CMP14+EGFR
ALT: 16 IU/L (ref 0–32)
AST: 13 IU/L (ref 0–40)
Albumin/Globulin Ratio: 1.6 (ref 1.2–2.2)
Albumin: 4.4 g/dL (ref 3.8–4.8)
Alkaline Phosphatase: 74 IU/L (ref 44–121)
BUN/Creatinine Ratio: 7 — ABNORMAL LOW (ref 9–23)
BUN: 6 mg/dL (ref 6–24)
Bilirubin Total: 0.7 mg/dL (ref 0.0–1.2)
CO2: 25 mmol/L (ref 20–29)
Calcium: 9.6 mg/dL (ref 8.7–10.2)
Chloride: 98 mmol/L (ref 96–106)
Creatinine, Ser: 0.84 mg/dL (ref 0.57–1.00)
GFR calc Af Amer: 100 mL/min/{1.73_m2} (ref >59)
GFR calc non Af Amer: 87 mL/min/{1.73_m2} (ref >59)
Globulin, Total: 2.8 g/dL (ref 1.5–4.5)
Glucose: 104 mg/dL — ABNORMAL HIGH (ref 65–99)
Potassium: 4.5 mmol/L (ref 3.5–5.2)
Sodium: 141 mmol/L (ref 134–144)
Total Protein: 7.2 g/dL (ref 6.0–8.5)

## 2020-05-26 LAB — TSH: TSH: 5.06 u[IU]/mL — ABNORMAL HIGH (ref 0.450–4.500)

## 2020-05-26 LAB — T4, FREE: Free T4: 1.47 ng/dL (ref 0.82–1.77)

## 2020-06-07 ENCOUNTER — Other Ambulatory Visit: Payer: Self-pay | Admitting: Internal Medicine

## 2020-06-07 ENCOUNTER — Encounter: Payer: Self-pay | Admitting: Internal Medicine

## 2020-07-03 ENCOUNTER — Encounter: Payer: Self-pay | Admitting: Internal Medicine

## 2020-07-06 ENCOUNTER — Other Ambulatory Visit (HOSPITAL_COMMUNITY): Payer: Self-pay | Admitting: Internal Medicine

## 2020-07-06 ENCOUNTER — Other Ambulatory Visit: Payer: Self-pay

## 2020-07-06 MED ORDER — OZEMPIC (0.25 OR 0.5 MG/DOSE) 2 MG/1.5ML ~~LOC~~ SOPN: 0.5000 mg | PEN_INJECTOR | SUBCUTANEOUS | 0 refills | Status: DC

## 2020-07-06 MED ORDER — WEGOVY 0.5 MG/0.5ML ~~LOC~~ SOAJ: 0.5000 mg | SUBCUTANEOUS | 0 refills | Status: DC

## 2020-07-07 ENCOUNTER — Other Ambulatory Visit: Payer: Self-pay | Admitting: Internal Medicine

## 2020-07-08 ENCOUNTER — Telehealth: Payer: Self-pay | Admitting: Family Medicine

## 2020-07-08 ENCOUNTER — Encounter: Payer: Self-pay | Admitting: Family Medicine

## 2020-07-08 DIAGNOSIS — T783XXA Angioneurotic edema, initial encounter: Secondary | ICD-10-CM

## 2020-07-08 NOTE — Patient Instructions (Signed)
ED is advised

## 2020-07-08 NOTE — Progress Notes (Signed)
[name], [place] are scheduled for a virtual visit with your provider today.    Just as we do with appointments in the office, we must obtain your consent to participate.  Your consent will be active for this visit and any virtual visit you may have with one of our providers in the next 365 days.    If you have a MyChart account, I can also send a copy of this consent to you electronically.  All virtual visits are billed to your insurance company just like a traditional visit in the office.  As this is a virtual visit, video technology does not allow for your provider to perform a traditional examination.  This may limit your provider's ability to fully assess your condition.  If your provider identifies any concerns that need to be evaluated in person or the need to arrange testing such as labs, EKG, etc, we will make arrangements to do so.    Although advances in technology are sophisticated, we cannot ensure that it will always work on either your end or our end.  If the connection with a video visit is poor, we may have to switch to a telephone visit.  With either a video or telephone visit, we are not always able to ensure that we have a secure connection.   I need to obtain your verbal consent now.   Are you willing to proceed with your visit today?   Toby Caradonna has provided verbal consent on 07/08/2020 for a virtual visit (video or telephone).   Freddy Finner, NP 07/08/2020  2:37 PM   Date:  07/08/2020   ID:  Sarah Barton, DOB [birth year], MRN [id number]  Patient Location: Home Provider Location: Home Office   Participants: Patient and Provider for Visit and Wrap up  Method of visit: Video  Location of Patient: Home Location of Provider: Home Office Consent was obtain for visit over the video. Services rendered by provider: Visit was performed via video  A video enabled telemedicine application was used and I verified that I am speaking with the correct person using two  identifiers.  PCP:  Dorothyann Peng, MD   Chief Complaint:  Swollen lips  History of Present Illness:    Sarah Barton is a 40 y.o. female with history as stated below. Presents video telehealth for an acute care visit due to swollen lips and hives. She reports she woke up with hives and swollen lips. Is on ACE but has not taken in a while per her. She does not know what could have caused this. Had it happen in the past and was sent to an allergist and they could not figure it out. She has started to develop some hoarseness with talking even after taking benadryl twice to try and help. Hives improved mildly with benadryl.   Given the voice change and risk of airway compromise, she is advised to go to the ED- she is in route now.  Past Medical, Surgical, Social History, Allergies, and Medications have been Reviewed.  Past Medical History:  Diagnosis Date   Angio-edema    Depression    Dysmetabolic syndrome    Hypertension    Hypothyroidism    Morbid obesity (HCC)    Obesity, morbid (HCC)    Sciatica    Thyroid disease    Urticaria     No outpatient medications have been marked as taking for the 07/08/20 encounter (Video Visit) with Freddy Finner, NP.     Allergies:   Patient  has no known allergies.   ROS See HPI for history of present illness.  Physical Exam Constitutional:      Appearance: She is obese.  HENT:     Head: Normocephalic.     Right Ear: External ear normal.     Left Ear: External ear normal.     Mouth/Throat:     Mouth: Angioedema present.  Eyes:     Extraocular Movements: Extraocular movements intact.     Conjunctiva/sclera: Conjunctivae normal.  Pulmonary:     Effort: No respiratory distress.     Breath sounds: No stridor.  Musculoskeletal:        General: Normal range of motion.     Cervical back: Normal range of motion.  Neurological:     Mental Status: She is alert and oriented to person, place, and time.  Psychiatric:        Mood  and Affect: Mood normal.        Behavior: Behavior normal.        Thought Content: Thought content normal.        Judgment: Judgment normal.               1. Angio-edema, initial encounter -Advised to proceed to the nearest ED secondary to airway involvement compromise -Pt verbalized understanding and is on the way    Time:   Today, I have spent 10  minutes with the patient with telehealth technology discussing the above problems, reviewing the chart, previous notes, medications and orders.    Tests Ordered: No orders of the defined types were placed in this encounter.   Medication Changes: No orders of the defined types were placed in this encounter.    Disposition:  Follow up ED now Signed, Freddy Finner, NP  07/08/2020 2:37 PM

## 2020-07-20 ENCOUNTER — Ambulatory Visit: Payer: BC Managed Care – PPO | Admitting: Internal Medicine

## 2020-08-14 ENCOUNTER — Telehealth: Payer: Self-pay

## 2020-08-14 NOTE — Telephone Encounter (Signed)
Prior auth done for wegov[employer]. Waiting on a response from the pt's insurance company.

## 2020-08-17 ENCOUNTER — Other Ambulatory Visit: Payer: Self-pay | Admitting: Internal Medicine

## 2020-08-17 MED FILL — Semaglutide (Weight Mngmt) Soln Auto-Injector 0.5 MG/0.5ML: SUBCUTANEOUS | 28 days supply | Qty: 2 | Fill #0 | Status: CN

## 2020-08-18 ENCOUNTER — Other Ambulatory Visit (HOSPITAL_COMMUNITY): Payer: Self-pay

## 2020-08-18 MED ORDER — WEGOVY 0.5 MG/0.5ML ~~LOC~~ SOAJ
0.5000 mg | SUBCUTANEOUS | 0 refills | Status: DC
  Filled 2020-08-18: qty 2, 28d supply, fill #0
  Filled 2020-08-26: qty 2, 30d supply, fill #0
  Filled 2020-09-08: qty 2, 28d supply, fill #0

## 2020-08-19 ENCOUNTER — Encounter: Payer: BC Managed Care – PPO | Admitting: Internal Medicine

## 2020-08-24 DIAGNOSIS — F331 Major depressive disorder, recurrent, moderate: Secondary | ICD-10-CM | POA: Diagnosis not present

## 2020-08-24 DIAGNOSIS — F411 Generalized anxiety disorder: Secondary | ICD-10-CM | POA: Diagnosis not present

## 2020-08-26 ENCOUNTER — Other Ambulatory Visit (HOSPITAL_COMMUNITY): Payer: Self-pay

## 2020-08-29 ENCOUNTER — Other Ambulatory Visit: Payer: Self-pay | Admitting: Internal Medicine

## 2020-09-08 ENCOUNTER — Other Ambulatory Visit (HOSPITAL_COMMUNITY): Payer: Self-pay

## 2020-09-21 DIAGNOSIS — Z6841 Body Mass Index (BMI) 40.0 and over, adult: Secondary | ICD-10-CM | POA: Diagnosis not present

## 2020-09-21 DIAGNOSIS — N915 Oligomenorrhea, unspecified: Secondary | ICD-10-CM | POA: Diagnosis not present

## 2020-09-21 DIAGNOSIS — Z1231 Encounter for screening mammogram for malignant neoplasm of breast: Secondary | ICD-10-CM | POA: Diagnosis not present

## 2020-09-21 DIAGNOSIS — Z01419 Encounter for gynecological examination (general) (routine) without abnormal findings: Secondary | ICD-10-CM | POA: Diagnosis not present

## 2020-09-23 ENCOUNTER — Other Ambulatory Visit: Payer: Self-pay | Admitting: Internal Medicine

## 2020-09-23 ENCOUNTER — Encounter: Payer: Self-pay | Admitting: Internal Medicine

## 2020-09-23 ENCOUNTER — Ambulatory Visit (INDEPENDENT_AMBULATORY_CARE_PROVIDER_SITE_OTHER): Payer: BC Managed Care – PPO | Admitting: Internal Medicine

## 2020-09-23 ENCOUNTER — Other Ambulatory Visit: Payer: Self-pay

## 2020-09-23 VITALS — BP 136/82 | HR 88 | Temp 98.3°F | Ht 62.0 in | Wt >= 6400 oz

## 2020-09-23 DIAGNOSIS — H6122 Impacted cerumen, left ear: Secondary | ICD-10-CM

## 2020-09-23 DIAGNOSIS — I10 Essential (primary) hypertension: Secondary | ICD-10-CM | POA: Diagnosis not present

## 2020-09-23 DIAGNOSIS — R600 Localized edema: Secondary | ICD-10-CM | POA: Diagnosis not present

## 2020-09-23 DIAGNOSIS — R7309 Other abnormal glucose: Secondary | ICD-10-CM

## 2020-09-23 DIAGNOSIS — Z0000 Encounter for general adult medical examination without abnormal findings: Secondary | ICD-10-CM | POA: Diagnosis not present

## 2020-09-23 DIAGNOSIS — M7989 Other specified soft tissue disorders: Secondary | ICD-10-CM

## 2020-09-23 LAB — POCT URINALYSIS DIPSTICK
Bilirubin, UA: NEGATIVE
Blood, UA: NEGATIVE
Glucose, UA: NEGATIVE
Ketones, UA: NEGATIVE
Leukocytes, UA: NEGATIVE
Nitrite, UA: NEGATIVE
Protein, UA: NEGATIVE
Spec Grav, UA: >=1.03 — AB (ref 1.010–1.025)
Urobilinogen, UA: 0.2 E.U./dL
pH, UA: 5.5 (ref 5.0–8.0)

## 2020-09-23 LAB — POCT UA - MICROALBUMIN
Albumin/Creatinine Ratio, Urine, POC: 30
Creatinine, POC: 300 mg/dL
Microalbumin Ur, POC: 30 mg/L

## 2020-09-23 NOTE — Patient Instructions (Signed)
Health Maintenance, Female Adopting a healthy lifestyle and getting preventive care are important in promoting health and wellness. Ask your health care provider about:  The right schedule for you to have regular tests and exams.  Things you can do on your own to prevent diseases and keep yourself healthy. What should I know about diet, weight, and exercise? Eat a healthy diet  Eat a diet that includes plenty of vegetables, fruits, low-fat dairy products, and lean protein.  Do not eat a lot of foods that are high in solid fats, added sugars, or sodium.   Maintain a healthy weight Body mass index (BMI) is used to identify weight problems. It estimates body fat based on height and weight. Your health care provider can help determine your BMI and help you achieve or maintain a healthy weight. Get regular exercise Get regular exercise. This is one of the most important things you can do for your health. Most adults should:  Exercise for at least 150 minutes each week. The exercise should increase your heart rate and make you sweat (moderate-intensity exercise).  Do strengthening exercises at least twice a week. This is in addition to the moderate-intensity exercise.  Spend less time sitting. Even light physical activity can be beneficial. Watch cholesterol and blood lipids Have your blood tested for lipids and cholesterol at 42 years of age, then have this test every 5 years. Have your cholesterol levels checked more often if:  Your lipid or cholesterol levels are high.  You are older than 42 years of age.  You are at high risk for heart disease. What should I know about cancer screening? Depending on your health history and family history, you may need to have cancer screening at various ages. This may include screening for:  Breast cancer.  Cervical cancer.  Colorectal cancer.  Skin cancer.  Lung cancer. What should I know about heart disease, diabetes, and high blood  pressure? Blood pressure and heart disease  High blood pressure causes heart disease and increases the risk of stroke. This is more likely to develop in people who have high blood pressure readings, are of African descent, or are overweight.  Have your blood pressure checked: ? Every 3-5 years if you are 18-39 years of age. ? Every year if you are 40 years old or older. Diabetes Have regular diabetes screenings. This checks your fasting blood sugar level. Have the screening done:  Once every three years after age 40 if you are at a normal weight and have a low risk for diabetes.  More often and at a younger age if you are overweight or have a high risk for diabetes. What should I know about preventing infection? Hepatitis B If you have a higher risk for hepatitis B, you should be screened for this virus. Talk with your health care provider to find out if you are at risk for hepatitis B infection. Hepatitis C Testing is recommended for:  Everyone born from 1945 through 1965.  Anyone with known risk factors for hepatitis C. Sexually transmitted infections (STIs)  Get screened for STIs, including gonorrhea and chlamydia, if: ? You are sexually active and are younger than 42 years of age. ? You are older than 42 years of age and your health care provider tells you that you are at risk for this type of infection. ? Your sexual activity has changed since you were last screened, and you are at increased risk for chlamydia or gonorrhea. Ask your health care provider   if you are at risk.  Ask your health care provider about whether you are at high risk for HIV. Your health care provider may recommend a prescription medicine to help prevent HIV infection. If you choose to take medicine to prevent HIV, you should first get tested for HIV. You should then be tested every 3 months for as long as you are taking the medicine. Pregnancy  If you are about to stop having your period (premenopausal) and  you may become pregnant, seek counseling before you get pregnant.  Take 400 to 800 micrograms (mcg) of folic acid every day if you become pregnant.  Ask for birth control (contraception) if you want to prevent pregnancy. Osteoporosis and menopause Osteoporosis is a disease in which the bones lose minerals and strength with aging. This can result in bone fractures. If you are 65 years old or older, or if you are at risk for osteoporosis and fractures, ask your health care provider if you should:  Be screened for bone loss.  Take a calcium or vitamin D supplement to lower your risk of fractures.  Be given hormone replacement therapy (HRT) to treat symptoms of menopause. Follow these instructions at home: Lifestyle  Do not use any products that contain nicotine or tobacco, such as cigarettes, e-cigarettes, and chewing tobacco. If you need help quitting, ask your health care provider.  Do not use street drugs.  Do not share needles.  Ask your health care provider for help if you need support or information about quitting drugs. Alcohol use  Do not drink alcohol if: ? Your health care provider tells you not to drink. ? You are pregnant, may be pregnant, or are planning to become pregnant.  If you drink alcohol: ? Limit how much you use to 0-1 drink a day. ? Limit intake if you are breastfeeding.  Be aware of how much alcohol is in your drink. In the [place] one drink equals one 12 oz bottle of beer (355 mL), one 5 oz glass of wine (148 mL), or one 1 oz glass of hard liquor (44 mL). General instructions  Schedule regular health, dental, and eye exams.  Stay current with your vaccines.  Tell your health care provider if: ? You often feel depressed. ? You have ever been abused or do not feel safe at home. Summary  Adopting a healthy lifestyle and getting preventive care are important in promoting health and wellness.  Follow your health care provider's instructions about healthy  diet, exercising, and getting tested or screened for diseases.  Follow your health care provider's instructions on monitoring your cholesterol and blood pressure. This information is not intended to replace advice given to you by your health care provider. Make sure you discuss any questions you have with your health care provider. Document Revised: 04/25/2018 Document Reviewed: 04/25/2018 Elsevier Patient Education  2021 Elsevier Inc.  

## 2020-09-23 NOTE — Progress Notes (Signed)
Pura Spice as a Neurosurgeon for Gwynneth Aliment, MD.,have documented all relevant documentation on the behalf of Gwynneth Aliment, MD,as directed by  Gwynneth Aliment, MD while in the presence of Gwynneth Aliment, MD.  This visit occurred during the SARS-CoV-2 public health emergency.  Safety protocols were in place, including screening questions prior to the visit, additional usage of staff PPE, and extensive cleaning of exam room while observing appropriate contact time as indicated for disinfecting solutions.  Subjective:     Patient ID: Sarah Barton , female    DOB: 11-28-1980 , 40 y.o.   MRN: [id number]   Chief Complaint  Patient presents with  . Annual Exam  . Hypertension    HPI  She is here today for a full physical exam.  She is followed by Henreitta Leber, PA for her pelvic exams. She was last seen within the past week or two. She has no new concerns at this time.   Hypertension This is a chronic problem. The current episode started more than 1 year ago. The problem has been gradually improving since onset. The problem is controlled. Pertinent negatives include no blurred vision, chest pain, palpitations or shortness of breath. Past treatments include diuretics and ACE inhibitors. The current treatment provides moderate improvement. Compliance problems include exercise.      Past Medical History:  Diagnosis Date  . Angio-edema   . Depression   . Dysmetabolic syndrome   . Hypertension   . Hypothyroidism   . Morbid obesity (HCC)   . Obesity, morbid (HCC)   . Sciatica   . Thyroid disease   . Urticaria      Family History  Problem Relation Age of Onset  . Hypertension Mother   . Migraines Mother   . Anemia Mother   . Asthma Mother   . Cancer Maternal Grandfather        Colon  . Cancer Paternal Grandmother        Breast  . Diabetes Paternal Uncle   . Kidney disease Paternal Uncle   . Diabetes Paternal Aunt   . Migraines Maternal Aunt   . Sarcoidosis Father    . Gout Father   . Prostate cancer Father      Current Outpatient Medications:  .  albuterol (VENTOLIN HFA) 108 (90 Base) MCG/ACT inhaler, Inhale 2 puffs into the lungs every 6 (six) hours as needed for wheezing or shortness of breath., Disp: 18 g, Rfl: 1 .  ALPRAZolam (XANAX) 1 MG tablet, , Disp: , Rfl:  .  EPINEPHrine 0.3 mg/0.3 mL IJ SOAJ injection, Inject 0.3 mLs (0.3 mg total) into the muscle as needed for anaphylaxis., Disp: 2 each, Rfl: 3 .  fluticasone (FLONASE) 50 MCG/ACT nasal spray, Place 1 spray into both nostrils daily., Disp: 16 g, Rfl: 5 .  levothyroxine (SYNTHROID) 150 MCG tablet, Take one tablet by mouth Monday- Friday. Take Saturday and Sunday., Disp: 30 tablet, Rfl: 11 .  omeprazole (PRILOSEC) 20 MG capsule, Take 20 mg by mouth daily., Disp: , Rfl:  .  rosuvastatin (CRESTOR) 20 MG tablet, TAKE 1 TABLET BY MOUTH EVERYDAY AT BEDTIME, Disp: 30 tablet, Rfl: 3 .  sertraline (ZOLOFT) 100 MG tablet, Take 100 mg by mouth daily. 2 tabs daily, Disp: , Rfl:  .  SYNTHROID 137 MCG tablet, TAKE 1 TABLET BY MOUTH DAILY (Patient taking differently: TAKE 1 TABLET BY MOUTH Saturday - Sunday), Disp: 90 tablet, Rfl: 0 .  Vitamin D, Ergocalciferol, (DRISDOL) 1.25 MG (50000  UNIT) CAPS capsule, TAKE 1 CAPSULE TWICE WEEKLY ON TUESDAYS/FRIDAYS, Disp: 24 capsule, Rfl: 0   Allergies  Allergen Reactions  . Ace Inhibitors Swelling      The patient states she uses none for birth control. Last LMP was No LMP recorded.. Negative for Dysmenorrhea. Negative for: breast discharge, breast lump(s), breast pain and breast self exam. Associated symptoms include abnormal vaginal bleeding. Pertinent negatives include abnormal bleeding (hematology), anxiety, decreased libido, depression, difficulty falling sleep, dyspareunia, history of infertility, nocturia, sexual dysfunction, sleep disturbances, urinary incontinence, urinary urgency, vaginal discharge and vaginal itching. Diet regular.The patient states  her exercise level is  intermittent.  . The patient's tobacco use is:  Social History   Tobacco Use  Smoking Status Never Smoker  Smokeless Tobacco Never Used  . She has been exposed to passive smoke. The patient's alcohol use is:  Social History   Substance and Sexual Activity  Alcohol Use Yes   Comment: rarely    Review of Systems  Constitutional: Negative.   HENT: Negative.   Eyes: Negative.  Negative for blurred vision.  Respiratory: Negative.  Negative for shortness of breath.   Cardiovascular: Negative.  Negative for chest pain and palpitations.  Gastrointestinal: Negative.   Endocrine: Negative.   Genitourinary: Negative.   Musculoskeletal: Negative.   Skin: Negative.   Allergic/Immunologic: Negative.   Neurological: Negative.   Hematological: Negative.   Psychiatric/Behavioral: Negative.      Today's Vitals   09/23/20 1508  BP: 136/82  Pulse: 88  Temp: 98.3 F (36.8 C)  SpO2: 99%  Weight: (!) 456 lb 12.8 oz (207.2 kg)  Height: 5\' 2"  (1.575 m)  PainSc: 0-No pain   Body mass index is 83.55 kg/m.  Wt Readings from Last 3 Encounters:  09/23/20 (!) 456 lb 12.8 oz (207.2 kg)  05/25/20 (!) 462 lb 3.2 oz (209.7 kg)  02/11/20 (!) 460 lb 9.6 oz (208.9 kg)     Objective:  Physical Exam Vitals and nursing note reviewed.  Constitutional:      Appearance: Normal appearance. She is obese.     Comments: Pt examined in chair. Unable to get on exam table.   HENT:     Head: Normocephalic and atraumatic.     Right Ear: Tympanic membrane, ear canal and external ear normal.     Left Ear: Ear canal and external ear normal. There is impacted cerumen.     Nose:     Comments: Masked     Mouth/Throat:     Comments: Masked  Eyes:     Extraocular Movements: Extraocular movements intact.     Conjunctiva/sclera: Conjunctivae normal.     Pupils: Pupils are equal, round, and reactive to light.  Cardiovascular:     Rate and Rhythm: Normal rate and regular rhythm.      Pulses: Normal pulses.     Heart sounds: Normal heart sounds.  Pulmonary:     Effort: Pulmonary effort is normal.     Breath sounds: Normal breath sounds.  Abdominal:     General: Bowel sounds are normal.     Palpations: Abdomen is soft.     Comments: Obese, difficult to assess organomegaly  Genitourinary:    Comments: deferred Musculoskeletal:        General: Normal range of motion.     Cervical back: Normal range of motion and neck supple.     Comments: RLE soft tissue mass- 2x1.25 inches, firm. No overlying erythema  Skin:    General: Skin is warm  and dry.  Neurological:     General: No focal deficit present.     Mental Status: She is alert and oriented to person, place, and time.  Psychiatric:        Mood and Affect: Mood normal.        Behavior: Behavior normal.         Assessment And Plan:     1. Routine general medical examination at health care facility Comments: A full exam was performed. Importance of monthly self breast exams was discussed with the paitent. PATIENT IS ADVISED TO GET 30-45 MINUTES REGULAR EXERCISE NO LESS THAN FOUR TO FIVE DAYS PER WEEK - BOTH WEIGHTBEARING EXERCISES AND AEROBIC ARE RECOMMENDED.  PATIENT IS ADVISED TO FOLLOW A HEALTHY DIET WITH AT LEAST SIX FRUITS/VEGGIES PER DAY, DECREASE INTAKE OF RED MEAT, AND TO INCREASE FISH INTAKE TO TWO DAYS PER WEEK.  MEATS/FISH SHOULD NOT BE FRIED, BAKED OR BROILED IS PREFERABLE.  IT IS ALSO IMPORTANT TO CUT BACK ON YOUR SUGAR INTAKE. PLEASE AVOID ANYTHING WITH ADDED SUGAR, CORN SYRUP OR OTHER SWEETENERS. IF YOU MUST USE A SWEETENER, YOU CAN TRY STEVIA. IT IS ALSO IMPORTANT TO AVOID ARTIFICIALLY SWEETENERS AND DIET BEVERAGES. LASTLY, I SUGGEST WEARING SPF 50 SUNSCREEN ON EXPOSED PARTS AND ESPECIALLY WHEN IN THE DIRECT SUNLIGHT FOR AN EXTENDED PERIOD OF TIME.  PLEASE AVOID FAST FOOD RESTAURANTS AND INCREASE YOUR WATER INTAKE.  - CBC - Lipid panel - TSH - T4, free  2. Essential hypertension, benign Comments:  Chronic. Not currently taking meds. EKG performed, NSR w/ LAE.  She stopped lisinopril due to urticaria.She is willing to take a different medication.  I will send rx valsartan 80mg  to her local pharmacy. She will f/u in four to six weeks for re-evaluation.  - POCT UA - Microalbumin - POCT urinalysis dipstick - EKG 12-Lead  3. Left ear impacted cerumen Comments: After obtaining verbal consent, left ear was flushed by irrigation.  There were no complications and no TM abnormalities were noted.  - Ear Lavage  4. Other abnormal glucose Comments: Her a1c has been elevated in the past. I will recheck an a1c today. Advised to limit intake of sweetened beverages , including diet drinks.  - Hemoglobin A1c  5. Lower extremity edema Comments: Chronic. Advised to wear compression hose.   6. Soft tissue mass Comments: I will schedule ultrasound for affected area for further evaluation.      Patient was given opportunity to ask questions. Patient verbalized understanding of the plan and was able to repeat key elements of the plan. All questions were answered to their satisfaction.   I, , MD, have reviewed all documentation for this visit. The documentation on 10/03/20 for the exam, diagnosis, procedures, and orders are all accurate and complete.  THE PATIENT IS ENCOURAGED TO PRACTICE SOCIAL DISTANCING DUE TO THE COVID-19 PANDEMIC.

## 2020-09-24 ENCOUNTER — Encounter: Payer: Self-pay | Admitting: Internal Medicine

## 2020-09-24 LAB — CBC
Hematocrit: 41.3 % (ref 34.0–46.6)
Hemoglobin: 13.7 g/dL (ref 11.1–15.9)
MCH: 28 pg (ref 26.6–33.0)
MCHC: 33.2 g/dL (ref 31.5–35.7)
MCV: 84 fL (ref 79–97)
Platelets: 245 10*3/uL (ref 150–450)
RBC: 4.9 x10E6/uL (ref 3.77–5.28)
RDW: 14.3 % (ref 11.7–15.4)
WBC: 8.7 10*3/uL (ref 3.4–10.8)

## 2020-09-24 LAB — LIPID PANEL
Chol/HDL Ratio: 3.6 ratio (ref 0.0–4.4)
Cholesterol, Total: 210 mg/dL — ABNORMAL HIGH (ref 100–199)
HDL: 59 mg/dL (ref >39)
LDL Chol Calc (NIH): 134 mg/dL — ABNORMAL HIGH (ref 0–99)
Triglycerides: 93 mg/dL (ref 0–149)
VLDL Cholesterol Cal: 17 mg/dL (ref 5–40)

## 2020-09-24 LAB — TSH: TSH: 7.39 u[IU]/mL — ABNORMAL HIGH (ref 0.450–4.500)

## 2020-09-24 LAB — HEMOGLOBIN A1C
Est. average glucose Bld gHb Est-mCnc: 117 mg/dL
Hgb A1c MFr Bld: 5.7 % — ABNORMAL HIGH (ref 4.8–5.6)

## 2020-09-24 LAB — T4, FREE: Free T4: 1.44 ng/dL (ref 0.82–1.77)

## 2020-10-05 ENCOUNTER — Other Ambulatory Visit: Payer: Self-pay

## 2020-10-05 MED ORDER — VALSARTAN 80 MG PO TABS: 80.0000 mg | ORAL_TABLET | Freq: Every day | ORAL | 1 refills | Status: DC

## 2020-10-15 ENCOUNTER — Other Ambulatory Visit (HOSPITAL_COMMUNITY): Payer: Self-pay

## 2020-10-26 DIAGNOSIS — M25562 Pain in left knee: Secondary | ICD-10-CM | POA: Diagnosis not present

## 2020-10-26 DIAGNOSIS — M25522 Pain in left elbow: Secondary | ICD-10-CM | POA: Diagnosis not present

## 2020-10-30 ENCOUNTER — Other Ambulatory Visit: Payer: Self-pay | Admitting: Internal Medicine

## 2020-11-17 ENCOUNTER — Other Ambulatory Visit: Payer: Self-pay

## 2020-11-17 ENCOUNTER — Encounter: Payer: Self-pay | Admitting: Internal Medicine

## 2020-11-17 ENCOUNTER — Ambulatory Visit: Payer: BC Managed Care – PPO | Admitting: Internal Medicine

## 2020-11-17 VITALS — BP 124/86 | HR 98 | Temp 98.1°F | Ht 62.0 in | Wt >= 6400 oz

## 2020-11-17 DIAGNOSIS — E039 Hypothyroidism, unspecified: Secondary | ICD-10-CM

## 2020-11-17 DIAGNOSIS — M7989 Other specified soft tissue disorders: Secondary | ICD-10-CM | POA: Diagnosis not present

## 2020-11-17 DIAGNOSIS — I10 Essential (primary) hypertension: Secondary | ICD-10-CM | POA: Diagnosis not present

## 2020-11-17 DIAGNOSIS — Z6841 Body Mass Index (BMI) 40.0 and over, adult: Secondary | ICD-10-CM

## 2020-11-17 MED ORDER — ROSUVASTATIN CALCIUM 20 MG PO TABS: ORAL_TABLET | ORAL | 2 refills | Status: DC

## 2020-11-17 NOTE — Progress Notes (Signed)
I,Katawbba Wiggins,acting as a Education administrator for Maximino Greenland, MD.,have documented all relevant documentation on the behalf of Maximino Greenland, MD,as directed by  Maximino Greenland, MD while in the presence of Maximino Greenland, MD.  This visit occurred during the SARS-CoV-2 public health emergency.  Safety protocols were in place, including screening questions prior to the visit, additional usage of staff PPE, and extensive cleaning of exam room while observing appropriate contact time as indicated for disinfecting solutions.  Subjective:     Patient ID: Sarah Barton , female    DOB: 16-Dec-1980 , 40 y.o.   MRN: [id number]   Chief Complaint  Patient presents with   Hypertension    HPI She presents today for BP/Thyroid check. She reports compliance with meds. She denies headaches, chest pain and shortness of breath. She admits she is not yet exercising as she should.   Hypertension Pertinent negatives include no blurred vision, chest pain, orthopnea or shortness of breath.    Past Medical History:  Diagnosis Date   Angio-edema    Depression    Dysmetabolic syndrome    Hypertension    Hypothyroidism    Morbid obesity ([name])    Obesity, morbid ([name])    Sciatica    Thyroid disease    Urticaria      Family History  Problem Relation Age of Onset   Hypertension Mother    Migraines Mother    Anemia Mother    Asthma Mother    Cancer Maternal Grandfather        Colon   Cancer Paternal Grandmother        Breast   Diabetes Paternal Uncle    Kidney disease Paternal Uncle    Diabetes Paternal Aunt    Migraines Maternal Aunt    Sarcoidosis Father    Gout Father    Prostate cancer Father      Current Outpatient Medications:    albuterol (VENTOLIN HFA) 108 (90 Base) MCG/ACT inhaler, Inhale 2 puffs into the lungs every 6 (six) hours as needed for wheezing or shortness of breath., Disp: 18 g, Rfl: 1   ALPRAZolam (XANAX) 1 MG tablet, , Disp: , Rfl:    EPINEPHrine 0.3 mg/0.3 mL IJ SOAJ  injection, Inject 0.3 mLs (0.3 mg total) into the muscle as needed for anaphylaxis., Disp: 2 each, Rfl: 3   fluticasone (FLONASE) 50 MCG/ACT nasal spray, Place 1 spray into both nostrils daily., Disp: 16 g, Rfl: 5   levothyroxine (SYNTHROID) 150 MCG tablet, Take one tablet by mouth Monday- Friday. Take 137MCG Saturday and Sunday. (Patient taking differently: Take one tablet by mouth daily), Disp: 30 tablet, Rfl: 11   omeprazole (PRILOSEC) 20 MG capsule, Take 20 mg by mouth daily., Disp: , Rfl:    rosuvastatin (CRESTOR) 20 MG tablet, TAKE 1 TABLET BY MOUTH EVERYDAY AT BEDTIME, Disp: 90 tablet, Rfl: 2   sertraline (ZOLOFT) 100 MG tablet, Take 100 mg by mouth daily. 2 tabs daily, Disp: , Rfl:    valsartan (DIOVAN) 80 MG tablet, TAKE 1 TABLET BY MOUTH EVERY DAY, Disp: 30 tablet, Rfl: 1   Vitamin D, Ergocalciferol, (DRISDOL) 1.25 MG (50000 UNIT) CAPS capsule, TAKE 1 CAPSULE TWICE WEEKLY ON TUESDAYS/FRIDAYS, Disp: 24 capsule, Rfl: 0   SYNTHROID 137 MCG tablet, TAKE 1 TABLET BY MOUTH DAILY (Patient not taking: Reported on 11/17/2020), Disp: 90 tablet, Rfl: 0   Allergies  Allergen Reactions   Ace Inhibitors Swelling     Review of Systems  Constitutional: Negative.  Eyes:  Negative for blurred vision.  Respiratory: Negative.  Negative for shortness of breath.   Cardiovascular: Negative.  Negative for chest pain and orthopnea.  Gastrointestinal: Negative.   Psychiatric/Behavioral: Negative.    All other systems reviewed and are negative.   Today's Vitals   11/17/20 1422  BP: 124/86  Pulse: 98  Temp: 98.1 F (36.7 C)  TempSrc: Oral  Weight: (!) 450 lb 9.6 oz (204.4 kg)  Height: '5\' 2"'  (1.575 m)   Body mass index is 82.42 kg/m.  Wt Readings from Last 3 Encounters:  11/17/20 (!) 450 lb 9.6 oz (204.4 kg)  09/23/20 (!) 456 lb 12.8 oz (207.2 kg)  05/25/20 (!) 462 lb 3.2 oz (209.7 kg)    BP Readings from Last 3 Encounters:  11/17/20 124/86  09/23/20 136/82  05/25/20 140/86    Objective:   Physical Exam Vitals and nursing note reviewed.  Constitutional:      Appearance: Normal appearance.  HENT:     Head: Normocephalic and atraumatic.     Nose:     Comments: Masked     Mouth/Throat:     Comments: Masked  Eyes:     Extraocular Movements: Extraocular movements intact.  Cardiovascular:     Rate and Rhythm: Normal rate and regular rhythm.     Heart sounds: Normal heart sounds.  Pulmonary:     Effort: Pulmonary effort is normal.     Breath sounds: Normal breath sounds.  Musculoskeletal:     Cervical back: Normal range of motion.  Skin:    General: Skin is warm.  Neurological:     General: No focal deficit present.     Mental Status: She is alert.  Psychiatric:        Mood and Affect: Mood normal.        Behavior: Behavior normal.        Assessment And Plan:     1. Essential hypertension, benign Comments: Chronic, controlled. She is encouraged to take meds as prescribed and to follow a low sodium diet.  She will f/u in 4-6 months.  - BMP8+EGFR  2. Primary hypothyroidism Comments: I will check thyroid panel and adjust meds as needed.  - TSH - T4, Free  3. Soft tissue mass Comments: I will refer her for ultrasound. I will make further recommendations once her results are available for review.  - US SOFT TISSUE LOWER EXTREMITY LIMITED RIGHT (NON-VASCULAR); Future  4. Class 3 severe obesity due to excess calories with serious comorbidity and body mass index (BMI) greater than or equal to 70 in adult [facility]) Comments: She is congratulated on her six pound weight loss since May 2022. She is encouraged to keep up the great work!     Patient was given opportunity to ask questions. Patient verbalized understanding of the plan and was able to repeat key elements of the plan. All questions were answered to their satisfaction.   I, Maximino Greenland, MD, have reviewed all documentation for this visit. The documentation on 11/17/20 for the exam, diagnosis, procedures,  and orders are all accurate and complete.   IF YOU HAVE BEEN REFERRED TO A SPECIALIST, IT MAY TAKE 1-2 WEEKS TO SCHEDULE/PROCESS THE REFERRAL. IF YOU HAVE NOT HEARD FROM US/SPECIALIST IN TWO WEEKS, PLEASE GIVE [place] A CALL AT [phone].   THE PATIENT IS ENCOURAGED TO PRACTICE SOCIAL DISTANCING DUE TO THE COVID-19 PANDEMIC.

## 2020-11-17 NOTE — Progress Notes (Signed)
I,Camden Mazzaferro,acting as a Neurosurgeon for Gwynneth Aliment, MD.,have documented all relevant documentation on the behalf of Gwynneth Aliment, MD,as directed by  Gwynneth Aliment, MD while in the presence of Gwynneth Aliment, MD.  This visit occurred during the SARS-CoV-2 public health emergency.  Safety protocols were in place, including screening questions prior to the visit, additional usage of staff PPE, and extensive cleaning of exam room while observing appropriate contact time as indicated for disinfecting solutions.  Subjective:     Patient ID: Sarah Barton , female    DOB: [birth year] , 40 y.o.   MRN: [id number]   Chief Complaint  Patient presents with   Hypertension    HPI  Hypertension Pertinent negatives include no blurred vision, chest pain, orthopnea or shortness of breath.    Past Medical History:  Diagnosis Date   Angio-edema    Depression    Dysmetabolic syndrome    Hypertension    Hypothyroidism    Morbid obesity (HCC)    Obesity, morbid (HCC)    Sciatica    Thyroid disease    Urticaria      Family History  Problem Relation Age of Onset   Hypertension Mother    Migraines Mother    Anemia Mother    Asthma Mother    Cancer Maternal Grandfather        Colon   Cancer Paternal Grandmother        Breast   Diabetes Paternal Uncle    Kidney disease Paternal Uncle    Diabetes Paternal Aunt    Migraines Maternal Aunt    Sarcoidosis Father    Gout Father    Prostate cancer Father      Current Outpatient Medications:    albuterol (VENTOLIN HFA) 108 (90 Base) MCG/ACT inhaler, Inhale 2 puffs into the lungs every 6 (six) hours as needed for wheezing or shortness of breath., Disp: 18 g, Rfl: 1   ALPRAZolam (XANAX) 1 MG tablet, , Disp: , Rfl:    EPINEPHrine 0.3 mg/0.3 mL IJ SOAJ injection, Inject 0.3 mLs (0.3 mg total) into the muscle as needed for anaphylaxis., Disp: 2 each, Rfl: 3   fluticasone (FLONASE) 50 MCG/ACT nasal spray, Place 1 spray into both nostrils  daily., Disp: 16 g, Rfl: 5   levothyroxine (SYNTHROID) 150 MCG tablet, Take one tablet by mouth Monday- Friday. Take Saturday and Sunday. (Patient taking differently: Take one tablet by mouth daily), Disp: 30 tablet, Rfl: 11   omeprazole (PRILOSEC) 20 MG capsule, Take 20 mg by mouth daily., Disp: , Rfl:    rosuvastatin (CRESTOR) 20 MG tablet, TAKE 1 TABLET BY MOUTH EVERYDAY AT BEDTIME, Disp: 90 tablet, Rfl: 2   sertraline (ZOLOFT) 100 MG tablet, Take 100 mg by mouth daily. 2 tabs daily, Disp: , Rfl:    valsartan (DIOVAN) 80 MG tablet, TAKE 1 TABLET BY MOUTH EVERY DAY, Disp: 30 tablet, Rfl: 1   Vitamin D, Ergocalciferol, (DRISDOL) 1.25 MG (50000 UNIT) CAPS capsule, TAKE 1 CAPSULE TWICE WEEKLY ON TUESDAYS/FRIDAYS, Disp: 24 capsule, Rfl: 0   SYNTHROID 137 MCG tablet, TAKE 1 TABLET BY MOUTH DAILY (Patient not taking: Reported on 11/17/2020), Disp: 90 tablet, Rfl: 0   Allergies  Allergen Reactions   Ace Inhibitors Swelling     Review of Systems  Constitutional: Negative.   Eyes:  Negative for blurred vision.  Respiratory: Negative.  Negative for shortness of breath.   Cardiovascular: Negative.  Negative for chest pain and orthopnea.  Gastrointestinal: Negative.  Psychiatric/Behavioral: Negative.    All other systems reviewed and are negative.   Today's Vitals   11/17/20 1422  BP: 124/86  Pulse: 98  Temp: 98.1 F (36.7 C)  TempSrc: Oral  Weight: (!) 450 lb 9.6 oz (204.4 kg)  Height: 5\' 2"  (1.575 m)   Body mass index is 82.42 kg/m.  Wt Readings from Last 3 Encounters:  11/17/20 (!) 450 lb 9.6 oz (204.4 kg)  09/23/20 (!) 456 lb 12.8 oz (207.2 kg)  05/25/20 (!) 462 lb 3.2 oz (209.7 kg)    BP Readings from Last 3 Encounters:  11/17/20 124/86  09/23/20 136/82  05/25/20 140/86    Objective:  Physical Exam      Assessment And Plan:     1. Essential hypertension, benign  2. Immunization due    Patient was given opportunity to ask questions. Patient verbalized  understanding of the plan and was able to repeat key elements of the plan. All questions were answered to their satisfaction.  07/23/20, CMA   I, Mariam Dollar, CMA, have reviewed all documentation for this visit. The documentation on 11/17/20 for the exam, diagnosis, procedures, and orders are all accurate and complete.   IF YOU HAVE BEEN REFERRED TO A SPECIALIST, IT MAY TAKE 1-2 WEEKS TO SCHEDULE/PROCESS THE REFERRAL. IF YOU HAVE NOT HEARD FROM US/SPECIALIST IN TWO WEEKS, PLEASE GIVE 01/18/21 A CALL AT [phone].   THE PATIENT IS ENCOURAGED TO PRACTICE SOCIAL DISTANCING DUE TO THE COVID-19 PANDEMIC.

## 2020-11-18 LAB — BMP8+EGFR
BUN/Creatinine Ratio: 13 (ref 9–23)
BUN: 8 mg/dL (ref 6–24)
CO2: 24 mmol/L (ref 20–29)
Calcium: 9.6 mg/dL (ref 8.7–10.2)
Chloride: 99 mmol/L (ref 96–106)
Creatinine, Ser: 0.64 mg/dL (ref 0.57–1.00)
Glucose: 97 mg/dL (ref 65–99)
Potassium: 4.8 mmol/L (ref 3.5–5.2)
Sodium: 139 mmol/L (ref 134–144)
eGFR: 113 mL/min/{1.73_m2} (ref >59)

## 2020-11-18 LAB — T4, FREE: Free T4: 1.69 ng/dL (ref 0.82–1.77)

## 2020-11-18 LAB — TSH: TSH: 4.38 u[IU]/mL (ref 0.450–4.500)

## 2020-11-19 ENCOUNTER — Other Ambulatory Visit: Payer: Self-pay | Admitting: Internal Medicine

## 2020-11-25 ENCOUNTER — Other Ambulatory Visit: Payer: Self-pay | Admitting: Internal Medicine

## 2020-11-26 ENCOUNTER — Ambulatory Visit: Payer: BC Managed Care – PPO | Admitting: Internal Medicine

## 2020-11-27 ENCOUNTER — Ambulatory Visit
Admission: RE | Admit: 2020-11-27 | Discharge: 2020-11-27 | Disposition: A | Discharge status: Home / Self Care | Payer: BC Managed Care – PPO | Source: Ambulatory Visit | Attending: Internal Medicine | Admitting: Internal Medicine

## 2020-11-27 DIAGNOSIS — R2241 Localized swelling, mass and lump, right lower limb: Secondary | ICD-10-CM | POA: Diagnosis not present

## 2020-11-27 DIAGNOSIS — M7989 Other specified soft tissue disorders: Secondary | ICD-10-CM

## 2020-12-19 ENCOUNTER — Other Ambulatory Visit: Payer: Self-pay | Admitting: Internal Medicine

## 2021-01-26 ENCOUNTER — Other Ambulatory Visit: Payer: Self-pay | Admitting: Internal Medicine

## 2021-02-24 DIAGNOSIS — F411 Generalized anxiety disorder: Secondary | ICD-10-CM | POA: Diagnosis not present

## 2021-02-24 DIAGNOSIS — F332 Major depressive disorder, recurrent severe without psychotic features: Secondary | ICD-10-CM | POA: Diagnosis not present

## 2021-03-12 ENCOUNTER — Encounter: Payer: Self-pay | Admitting: Internal Medicine

## 2021-03-15 ENCOUNTER — Other Ambulatory Visit: Payer: Self-pay

## 2021-03-15 MED ORDER — LEVOTHYROXINE SODIUM 150 MCG PO TABS: ORAL_TABLET | ORAL | 1 refills | Status: DC

## 2021-04-01 ENCOUNTER — Other Ambulatory Visit: Payer: Self-pay

## 2021-04-01 ENCOUNTER — Ambulatory Visit: Payer: BC Managed Care – PPO | Admitting: Internal Medicine

## 2021-04-01 ENCOUNTER — Encounter: Payer: Self-pay | Admitting: Internal Medicine

## 2021-04-01 VITALS — BP 134/80 | HR 109 | Temp 98.5°F | Ht 61.8 in | Wt >= 6400 oz

## 2021-04-01 DIAGNOSIS — Z6841 Body Mass Index (BMI) 40.0 and over, adult: Secondary | ICD-10-CM

## 2021-04-01 DIAGNOSIS — M7989 Other specified soft tissue disorders: Secondary | ICD-10-CM | POA: Diagnosis not present

## 2021-04-01 DIAGNOSIS — I10 Essential (primary) hypertension: Secondary | ICD-10-CM | POA: Diagnosis not present

## 2021-04-01 DIAGNOSIS — Z23 Encounter for immunization: Secondary | ICD-10-CM

## 2021-04-01 DIAGNOSIS — E039 Hypothyroidism, unspecified: Secondary | ICD-10-CM

## 2021-04-01 DIAGNOSIS — G4733 Obstructive sleep apnea (adult) (pediatric): Secondary | ICD-10-CM

## 2021-04-01 DIAGNOSIS — R7309 Other abnormal glucose: Secondary | ICD-10-CM

## 2021-04-01 NOTE — Patient Instructions (Signed)

## 2021-04-01 NOTE — Progress Notes (Signed)
Rich Brave Llittleton,acting as a Education administrator for Maximino Greenland, MD.,have documented all relevant documentation on the behalf of Maximino Greenland, MD,as directed by  Maximino Greenland, MD while in the presence of Maximino Greenland, MD.  This visit occurred during the SARS-CoV-2 public health emergency.  Safety protocols were in place, including screening questions prior to the visit, additional usage of staff PPE, and extensive cleaning of exam room while observing appropriate contact time as indicated for disinfecting solutions.  Subjective:     Patient ID: Sarah Barton , female    DOB: Jan 23, 1981 , 40 y.o.   MRN: [id number]   Chief Complaint  Patient presents with   Hypertension    HPI  Patient is here for Bp follow up. Patient admits that she forgot to take her pill this morning. She denies having headaches, chest pain and shortness of breath.   Hypertension This is a chronic problem. The current episode started more than 1 year ago. The problem has been gradually improving since onset. The problem is controlled. Pertinent negatives include no blurred vision, chest pain, orthopnea or shortness of breath. Risk factors for coronary artery disease include obesity and sedentary lifestyle. Past treatments include ACE inhibitors and diuretics. The current treatment provides moderate improvement. Compliance problems include exercise.     Past Medical History:  Diagnosis Date   Angio-edema    Depression    Dysmetabolic syndrome    Hypertension    Hypothyroidism    Morbid obesity ([place])    Obesity, morbid ([place])    Sciatica    Thyroid disease    Urticaria      Family History  Problem Relation Age of Onset   Hypertension Mother    Migraines Mother    Anemia Mother    Asthma Mother    Cancer Maternal Grandfather        Colon   Cancer Paternal Grandmother        Breast   Diabetes Paternal Uncle    Kidney disease Paternal Uncle    Diabetes Paternal Aunt    Migraines Maternal Aunt     Sarcoidosis Father    Gout Father    Prostate cancer Father      Current Outpatient Medications:    albuterol (VENTOLIN HFA) 108 (90 Base) MCG/ACT inhaler, Inhale 2 puffs into the lungs every 6 (six) hours as needed for wheezing or shortness of breath., Disp: 18 g, Rfl: 1   ALPRAZolam (XANAX) 1 MG tablet, , Disp: , Rfl:    EPINEPHrine 0.3 mg/0.3 mL IJ SOAJ injection, Inject 0.3 mLs (0.3 mg total) into the muscle as needed for anaphylaxis., Disp: 2 each, Rfl: 3   omeprazole (PRILOSEC) 20 MG capsule, Take 20 mg by mouth daily., Disp: , Rfl:    rosuvastatin (CRESTOR) 20 MG tablet, TAKE 1 TABLET BY MOUTH EVERYDAY AT BEDTIME, Disp: 90 tablet, Rfl: 2   sertraline (ZOLOFT) 100 MG tablet, Take 100 mg by mouth daily. 2 tabs daily, Disp: , Rfl:    valsartan (DIOVAN) 80 MG tablet, TAKE 1 TABLET BY MOUTH EVERY DAY, Disp: 90 tablet, Rfl: 1   Vitamin D, Ergocalciferol, (DRISDOL) 1.25 MG (50000 UNIT) CAPS capsule, TAKE 1 CAPSULE TWICE WEEKLY ON TUESDAYS/FRIDAYS, Disp: 24 capsule, Rfl: 0   [START ON 04/04/2021] levothyroxine (SYNTHROID) 137 MCG tablet, Take 1 tablet (137 mcg total) by mouth every Sunday., Disp: 15 tablet, Rfl: 1   levothyroxine (SYNTHROID) 150 MCG tablet, TAKE ONE TABLET BY MOUTH MONDAY- FRIDAY. TAKE 137MCG SATURDAY AND  SUNDAY., Disp: 60 tablet, Rfl: 5   Allergies  Allergen Reactions   Ace Inhibitors Swelling     Review of Systems  Constitutional: Negative.   Eyes:  Negative for blurred vision.  Respiratory: Negative.  Negative for shortness of breath.   Cardiovascular: Negative.  Negative for chest pain and orthopnea.  Neurological: Negative.   Psychiatric/Behavioral: Negative.      Today's Vitals   04/01/21 1442  BP: 134/80  Pulse: (!) 109  Temp: 98.5 F (36.9 C)  TempSrc: Oral  Weight: (!) 467 lb 12.8 oz (212.2 kg)  Height: 5' 1.8" (1.57 m)  PainSc: 0-No pain   Body mass index is 86.12 kg/m.  Wt Readings from Last 3 Encounters:  04/01/21 (!) 467 lb 12.8 oz (212.2 kg)   11/17/20 (!) 450 lb 9.6 oz (204.4 kg)  09/23/20 (!) 456 lb 12.8 oz (207.2 kg)    Objective:  Physical Exam Vitals and nursing note reviewed.  Constitutional:      Appearance: Normal appearance.  HENT:     Head: Normocephalic and atraumatic.     Nose:     Comments: Masked     Mouth/Throat:     Comments: Masked  Eyes:     Extraocular Movements: Extraocular movements intact.  Cardiovascular:     Rate and Rhythm: Normal rate and regular rhythm.     Heart sounds: Normal heart sounds.  Pulmonary:     Effort: Pulmonary effort is normal.     Breath sounds: Normal breath sounds.  Musculoskeletal:     Cervical back: Normal range of motion.  Skin:    General: Skin is warm.  Neurological:     General: No focal deficit present.     Mental Status: She is alert.  Psychiatric:        Mood and Affect: Mood normal.        Behavior: Behavior normal.        Assessment And Plan:     1. Essential hypertension Comments: Chronic, fair control. Admits she didn't take meds today. No med changes today. Encouraged to follow low sodium diet. She will f/u in 6 months.  - CMP14+EGFR - CBC no Diff  2. Primary hypothyroidism Comments: I will check a TSH and adjust meds as needed.  - TSH  3. OSA (obstructive sleep apnea) Comments: Chronic, not using CPAP. She agrees to referral to Neuro for repeat sleep study. - Ambulatory referral to Neurology  4. Soft tissue mass Comments: She now agrees to MRI RLE for further evaluation of this mass.  - MR TIBIA FIBULA RIGHT W WO CONTRAST; Future  5. Other abnormal glucose Comments: Her a1c has been elevated in the past.  I will recheck this today. Encouraged to limit her intake of sweetened beverages.  - Hemoglobin A1c  6. Class 3 severe obesity due to excess calories with serious comorbidity and body mass index (BMI) greater than or equal to 70 in adult [facility]) Comments: BMI 86. She states a friend suggested she try [employer]. She is encouraged to  contact them for an initial consultation.   7. Need for influenza vaccination - Flu Vaccine QUAD 6+ mos PF IM (Fluarix Quad PF)   Patient was given opportunity to ask questions. Patient verbalized understanding of the plan and was able to repeat key elements of the plan. All questions were answered to their satisfaction.   I, Maximino Greenland, MD, have reviewed all documentation for this visit. The documentation on 04/02/21 for the exam, diagnosis, procedures,  and orders are all accurate and complete.   IF YOU HAVE BEEN REFERRED TO A SPECIALIST, IT MAY TAKE 1-2 WEEKS TO SCHEDULE/PROCESS THE REFERRAL. IF YOU HAVE NOT HEARD FROM US/SPECIALIST IN TWO WEEKS, PLEASE GIVE [place] A CALL AT [phone].   THE PATIENT IS ENCOURAGED TO PRACTICE SOCIAL DISTANCING DUE TO THE COVID-19 PANDEMIC.

## 2021-04-02 ENCOUNTER — Other Ambulatory Visit: Payer: Self-pay | Admitting: Internal Medicine

## 2021-04-02 ENCOUNTER — Encounter: Payer: Self-pay | Admitting: Internal Medicine

## 2021-04-02 LAB — CMP14+EGFR
ALT: 15 IU/L (ref 0–32)
AST: 16 IU/L (ref 0–40)
Albumin/Globulin Ratio: 1.8 (ref 1.2–2.2)
Albumin: 4.6 g/dL (ref 3.8–4.8)
Alkaline Phosphatase: 72 IU/L (ref 44–121)
BUN/Creatinine Ratio: 14 (ref 9–23)
BUN: 10 mg/dL (ref 6–24)
Bilirubin Total: 0.6 mg/dL (ref 0.0–1.2)
CO2: 25 mmol/L (ref 20–29)
Calcium: 9.5 mg/dL (ref 8.7–10.2)
Chloride: 96 mmol/L (ref 96–106)
Creatinine, Ser: 0.71 mg/dL (ref 0.57–1.00)
Globulin, Total: 2.6 g/dL (ref 1.5–4.5)
Glucose: 94 mg/dL (ref 70–99)
Potassium: 4.7 mmol/L (ref 3.5–5.2)
Sodium: 138 mmol/L (ref 134–144)
Total Protein: 7.2 g/dL (ref 6.0–8.5)
eGFR: 109 mL/min/{1.73_m2} (ref >59)

## 2021-04-02 LAB — CBC
Hematocrit: 41.7 % (ref 34.0–46.6)
Hemoglobin: 13.4 g/dL (ref 11.1–15.9)
MCH: 27.1 pg (ref 26.6–33.0)
MCHC: 32.1 g/dL (ref 31.5–35.7)
MCV: 84 fL (ref 79–97)
Platelets: 265 10*3/uL (ref 150–450)
RBC: 4.94 x10E6/uL (ref 3.77–5.28)
RDW: 13.9 % (ref 11.7–15.4)
WBC: 8.6 10*3/uL (ref 3.4–10.8)

## 2021-04-02 LAB — HEMOGLOBIN A1C
Est. average glucose Bld gHb Est-mCnc: 120 mg/dL
Hgb A1c MFr Bld: 5.8 % — ABNORMAL HIGH (ref 4.8–5.6)

## 2021-04-02 LAB — TSH: TSH: 6.07 u[IU]/mL — ABNORMAL HIGH (ref 0.450–4.500)

## 2021-04-02 MED ORDER — LEVOTHYROXINE SODIUM 137 MCG PO TABS: 137.0000 ug | ORAL_TABLET | ORAL | 1 refills | Status: DC

## 2021-04-06 NOTE — Addendum Note (Signed)
Addended by: Gwynneth Aliment on: 04/06/2021 04:29 PM   Modules accepted: Orders

## 2021-05-20 DIAGNOSIS — F411 Generalized anxiety disorder: Secondary | ICD-10-CM | POA: Diagnosis not present

## 2021-05-20 DIAGNOSIS — F332 Major depressive disorder, recurrent severe without psychotic features: Secondary | ICD-10-CM | POA: Diagnosis not present

## 2021-06-01 ENCOUNTER — Ambulatory Visit: Payer: BC Managed Care – PPO | Admitting: Internal Medicine

## 2021-06-24 ENCOUNTER — Other Ambulatory Visit: Payer: Self-pay | Admitting: Internal Medicine

## 2021-06-30 ENCOUNTER — Other Ambulatory Visit: Payer: Self-pay

## 2021-06-30 ENCOUNTER — Other Ambulatory Visit: Payer: Self-pay | Admitting: Internal Medicine

## 2021-06-30 ENCOUNTER — Encounter: Payer: Self-pay | Admitting: Internal Medicine

## 2021-06-30 ENCOUNTER — Ambulatory Visit: Payer: BC Managed Care – PPO | Admitting: Internal Medicine

## 2021-06-30 VITALS — BP 132/76 | HR 100 | Temp 98.3°F | Ht 61.8 in | Wt >= 6400 oz

## 2021-06-30 DIAGNOSIS — N926 Irregular menstruation, unspecified: Secondary | ICD-10-CM

## 2021-06-30 DIAGNOSIS — E039 Hypothyroidism, unspecified: Secondary | ICD-10-CM | POA: Diagnosis not present

## 2021-06-30 DIAGNOSIS — I10 Essential (primary) hypertension: Secondary | ICD-10-CM | POA: Diagnosis not present

## 2021-06-30 DIAGNOSIS — Z6841 Body Mass Index (BMI) 40.0 and over, adult: Secondary | ICD-10-CM

## 2021-06-30 MED ORDER — VALSARTAN 80 MG PO TABS: 80.0000 mg | ORAL_TABLET | Freq: Every day | ORAL | 1 refills | Status: DC

## 2021-06-30 MED ORDER — WEGOVY 0.5 MG/0.5ML ~~LOC~~ SOAJ: 0.5000 mg | SUBCUTANEOUS | 0 refills | Status: DC

## 2021-06-30 NOTE — Patient Instructions (Signed)

## 2021-06-30 NOTE — Progress Notes (Signed)
Rich Brave Llittleton,acting as a Education administrator for Maximino Greenland, MD.,have documented all relevant documentation on the behalf of Maximino Greenland, MD,as directed by  Maximino Greenland, MD while in the presence of Maximino Greenland, MD.  This visit occurred during the SARS-CoV-2 public health emergency.  Safety protocols were in place, including screening questions prior to the visit, additional usage of staff PPE, and extensive cleaning of exam room while observing appropriate contact time as indicated for disinfecting solutions.  Subjective:     Patient ID: Sarah Barton , female    DOB: 1980-09-24 , 41 y.o.   MRN: [id number]   Chief Complaint  Patient presents with   Hypothyroidism    HPI  Patient is here for thyroid and bp check.. Patient reports compliance with her medications. She denies headaches, chest pain and shortness of breath. She has no questions or concerns at this time.  Hypertension This is a chronic problem. The current episode started more than 1 year ago. The problem has been gradually improving since onset. The problem is controlled. Pertinent negatives include no blurred vision, chest pain, orthopnea or shortness of breath. Risk factors for coronary artery disease include obesity and sedentary lifestyle. Past treatments include ACE inhibitors and diuretics. The current treatment provides moderate improvement. Compliance problems include exercise.     Past Medical History:  Diagnosis Date   Angio-edema    Depression    Dysmetabolic syndrome    Hypertension    Hypothyroidism    Morbid obesity ([place])    Obesity, morbid ([place])    Sciatica    Thyroid disease    Urticaria      Family History  Problem Relation Age of Onset   Hypertension Mother    Migraines Mother    Anemia Mother    Asthma Mother    Cancer Maternal Grandfather        Colon   Cancer Paternal Grandmother        Breast   Diabetes Paternal Uncle    Kidney disease Paternal Uncle    Diabetes Paternal Aunt     Migraines Maternal Aunt    Sarcoidosis Father    Gout Father    Prostate cancer Father      Current Outpatient Medications:    albuterol (VENTOLIN HFA) 108 (90 Base) MCG/ACT inhaler, Inhale 2 puffs into the lungs every 6 (six) hours as needed for wheezing or shortness of breath., Disp: 18 g, Rfl: 1   ALPRAZolam (XANAX) 1 MG tablet, , Disp: , Rfl:    EPINEPHrine 0.3 mg/0.3 mL IJ SOAJ injection, Inject 0.3 mLs (0.3 mg total) into the muscle as needed for anaphylaxis., Disp: 2 each, Rfl: 3   levothyroxine (SYNTHROID) 137 MCG tablet, Take 1 tablet (137 mcg total) by mouth every Sunday., Disp: 15 tablet, Rfl: 1   levothyroxine (SYNTHROID) 150 MCG tablet, TAKE ONE TABLET BY MOUTH MONDAY- FRIDAY. TAKE 137MCG SATURDAY AND SUNDAY., Disp: 60 tablet, Rfl: 5   rosuvastatin (CRESTOR) 20 MG tablet, TAKE 1 TABLET BY MOUTH EVERYDAY AT BEDTIME, Disp: 90 tablet, Rfl: 2   Semaglutide-Weight Management (WEGOVY) 0.5 MG/0.5ML SOAJ, Inject 0.5 mg into the skin once a week., Disp: 2 mL, Rfl: 0   sertraline (ZOLOFT) 100 MG tablet, Take 100 mg by mouth daily. 2 tabs daily, Disp: , Rfl:    Vitamin D, Ergocalciferol, (DRISDOL) 1.25 MG (50000 UNIT) CAPS capsule, TAKE 1 CAPSULE TWICE WEEKLY ON TUESDAYS/FRIDAYS, Disp: 24 capsule, Rfl: 0   omeprazole (PRILOSEC) 20 MG capsule, Take  20 mg by mouth daily. (Patient not taking: Reported on 06/30/2021), Disp: , Rfl:    valsartan (DIOVAN) 80 MG tablet, Take 1 tablet (80 mg total) by mouth daily., Disp: 90 tablet, Rfl: 1   Allergies  Allergen Reactions   Ace Inhibitors Swelling     Review of Systems  Constitutional: Negative.   Eyes:  Negative for blurred vision.  Respiratory: Negative.  Negative for shortness of breath.   Cardiovascular: Negative.  Negative for chest pain and orthopnea.  Genitourinary:  Positive for menstrual problem.       She c/o irregular menses. She states her last cycle was in Sept 2022.  She is followed by Earnst[facility] Regal for her GYN exams.    Neurological: Negative.   Psychiatric/Behavioral: Negative.      Today's Vitals   06/30/21 1538  BP: 132/76  Pulse: 100  Temp: 98.3 F (36.8 C)  Weight: (!) 472 lb 12.8 oz (214.5 kg)  Height: 5' 1.8" (1.57 m)  PainSc: 0-No pain   Body mass index is 87.04 kg/m.  Wt Readings from Last 3 Encounters:  06/30/21 (!) 472 lb 12.8 oz (214.5 kg)  04/01/21 (!) 467 lb 12.8 oz (212.2 kg)  11/17/20 (!) 450 lb 9.6 oz (204.4 kg)     Objective:  Physical Exam Vitals and nursing note reviewed.  Constitutional:      Appearance: Normal appearance. She is obese.  HENT:     Head: Normocephalic and atraumatic.     Nose:     Comments: Masked     Mouth/Throat:     Comments: Masked  Eyes:     Extraocular Movements: Extraocular movements intact.  Cardiovascular:     Rate and Rhythm: Normal rate and regular rhythm.     Heart sounds: Normal heart sounds.  Pulmonary:     Effort: Pulmonary effort is normal.     Breath sounds: Normal breath sounds.  Musculoskeletal:     Cervical back: Normal range of motion.  Skin:    General: Skin is warm.  Neurological:     General: No focal deficit present.     Mental Status: She is alert.  Psychiatric:        Mood and Affect: Mood normal.        Behavior: Behavior normal.        Assessment And Plan:     1. Primary hypothyroidism Comments: I will check thyroid panel and adjust meds as needed. She will rto in May 2023 for her next physical exam.  - TSH - T4, Free - T3, free  2. Essential hypertension Comments: Chronic, fair control. Admits she didn't take meds today. No med changes. Goal BP<130/80. She is encouraged to follow low sodium diet.   3. Irregular menses Comments: Last cycle was Sept 2022.  I will refer her to Earnstine Regal, GYN for further evaluation.  - Ambulatory referral to Gynecology  4. Class 3 severe obesity due to excess calories with serious comorbidity and body mass index (BMI) greater than or equal to 70 in adult  [facility]) Comments: BMI 87. She has gained 5 lbs since her last visit. She agrees to referral to [facility] for their Bariatric program. - Ambulatory referral to Internal Medicine   Patient was given opportunity to ask questions. Patient verbalized understanding of the plan and was able to repeat key elements of the plan. All questions were answered to their satisfaction.   I, Maximino Greenland, MD, have reviewed all documentation for this visit. The documentation  on 06/30/21 for the exam, diagnosis, procedures, and orders are all accurate and complete.   IF YOU HAVE BEEN REFERRED TO A SPECIALIST, IT MAY TAKE 1-2 WEEKS TO SCHEDULE/PROCESS THE REFERRAL. IF YOU HAVE NOT HEARD FROM US/SPECIALIST IN TWO WEEKS, PLEASE GIVE [place] A CALL AT [phone].   THE PATIENT IS ENCOURAGED TO PRACTICE SOCIAL DISTANCING DUE TO THE COVID-19 PANDEMIC.

## 2021-07-01 LAB — T3, FREE: T3, Free: 2.5 pg/mL (ref 2.0–4.4)

## 2021-07-01 LAB — TSH: TSH: 7.69 u[IU]/mL — ABNORMAL HIGH (ref 0.450–4.500)

## 2021-07-01 LAB — T4, FREE: Free T4: 1.28 ng/dL (ref 0.82–1.77)

## 2021-07-06 ENCOUNTER — Institutional Professional Consult (permissible substitution): Payer: BC Managed Care – PPO | Admitting: Neurology

## 2021-07-16 NOTE — Telephone Encounter (Signed)
Will attempt PA

## 2021-07-28 ENCOUNTER — Ambulatory Visit: Payer: BC Managed Care – PPO | Admitting: Neurology

## 2021-07-28 ENCOUNTER — Encounter: Payer: Self-pay | Admitting: Neurology

## 2021-07-28 VITALS — BP 155/91 | HR 140 | Ht 62.0 in | Wt >= 6400 oz

## 2021-07-28 DIAGNOSIS — Z6841 Body Mass Index (BMI) 40.0 and over, adult: Secondary | ICD-10-CM | POA: Insufficient documentation

## 2021-07-28 DIAGNOSIS — G4719 Other hypersomnia: Secondary | ICD-10-CM | POA: Diagnosis not present

## 2021-07-28 DIAGNOSIS — Z87898 Personal history of other specified conditions: Secondary | ICD-10-CM

## 2021-07-28 DIAGNOSIS — I10 Essential (primary) hypertension: Secondary | ICD-10-CM

## 2021-07-28 DIAGNOSIS — G478 Other sleep disorders: Secondary | ICD-10-CM

## 2021-07-28 NOTE — Patient Instructions (Signed)
Obesity-Hypoventilation Syndrome ?Obesity-hypoventilation syndrome (OHS) is a condition in which a person cannot efficiently move air in and out of the lungs (ventilate). This condition causes a buildup of carbon dioxidelevels in the blood and a drop in oxygen levels. ?OHS can increase the risk for: ?Cor pulmonale, or right-sided heart failure. ?Left-sided heart failure. ?Pulmonary hypertension, or high blood pressure in the arteries in the lungs. ?Too many red blood cells in the body. ?Disability or death. ?What are the causes? ?This condition may be caused by: ?Being obese with a BMI (body mass index) greater than or equal to 30 kg/m2. ?Having too much fat around the abdomen, chest, and neck. ?The brain being unable to properly manage the high carbon dioxide and low oxygen levels. ?Hormones made by fat cells. These hormones may interfere with breathing. ?Sleep apnea. This is when breathing stops, pauses, or is shallow during sleep. ?What are the signs or symptoms? ?Symptoms of this condition include: ?Feeling sleepy during the day. ?Headaches. These may be worse in the morning. ?Shortness of breath. ?Snoring, choking, gasping, or trouble breathing during sleep. ?Poor concentration or poor memory. ?Mood changes or feeling irritable. ?Depression. ?How is this diagnosed? ?This condition may be diagnosed by: ?BMI measurement. ?Blood tests to measure blood levels of serum bicarbonate, carbon dioxide, and oxygen. ?Pulse oximetry to measure the amount of oxygen in your blood. This uses a small device that is placed on your finger, earlobe, or toe. ?Polysomnogram, or sleep study, to check your breathing patterns and levels of oxygen and carbon dioxide while you sleep. ?You may also have other tests, including: ?A chest X-ray to rule out other breathing problems. ?Lung tests, or pulmonary function tests, to rule out other breathing problems. ?Electrocardiogram (ECG) or echocardiogram to check for signs of heart  failure. ?How is this treated? ?This condition may be treated with: ?A device such as a continuous positive airway pressure (CPAP) machine or a bi-level positive airway pressure (BIPAP) machine. These devices deliver pressure and sometimes oxygen to make breathing easier. A mask may be placed over your nose or mouth. ?Oxygen if your blood oxygen levels are low. ?A weight-loss program. ?Bariatric, or weight-loss, surgery. ?Tracheostomy. A tube is placed in the windpipe through the neck to help with breathing. ?Follow these instructions at home: ?Medicines ?Take over-the-counter and prescription medicines only as told by your health care provider. ?Ask your health care provider what medicines are safe for you. You may be told to avoid medicines such as sedatives and narcotics. These can affect breathing and make OHS worse. ?Sleeping habits ?If you are prescribed a CPAP or a BIPAP machine, make sure you understand how to use it. Use your CPAP or BIPAP machine only as told by your health care provider. ?Try to get at least 8 hours of sleep every night. ?Eating and drinking ? ?Eat foods that are high in fiber, such as beans, whole grains, and fresh fruits and vegetables. ?Limit foods that are high in fat and processed sugars, such as fried or sweet foods. ?Drink enough fluid to keep your urine pale yellow. ?Do not drink alcohol if: ?Your health care provider tells you not to drink. ?You are pregnant, may be pregnant, or are planning to become pregnant. ?General instructions ?Follow a diet and exercise plan that helps you reach and keep a healthy weight as told by your health care provider. ?Exercise regularly as told by your health care provider. ?Do not use any products that contain nicotine or tobacco. These products include   cigarettes, chewing tobacco, and vaping devices, such as e-cigarettes. If you need help quitting, ask your health care provider. ?Keep all follow-up visits. This is important. ?Contact a health  care provider if: ?You develop new or worsening shortness of breath. ?You are having trouble waking up or staying awake. ?You are confused. ?You develop a cough. ?You have a fever. ?Get help right away if: ?You have chest pain. ?You have fast or irregular heartbeats. ?You are dizzy or you faint. ?You have any symptoms of a stroke. "BE FAST" is an easy way to remember the main warning signs of a stroke: ?B - Balance. Signs are dizziness, sudden trouble walking, or loss of balance. ?E - Eyes. Signs are trouble seeing or a sudden change in vision. ?F - Face. Signs are sudden weakness or numbness of the face, or the face or eyelid drooping on one side. ?A - Arms. Signs are weakness or numbness in an arm. This happens suddenly and usually on one side of the body. ?S - Speech. Signs are sudden trouble speaking, slurred speech, or trouble understanding what people say. ?T - Time. Time to call emergency services. Write down what time symptoms started. ?You have other signs of a stroke, such as: ?A sudden, severe headache with no known cause. ?Nausea or vomiting. ?Seizure. ?These symptoms may be an emergency. Get help right away. Call 911. ?Do not wait to see if the symptoms will go away. ?Do not drive yourself to the hospital. ?Summary ?Obesity-hypoventilation syndrome (OHS) causes a buildup of carbon dioxidelevels in the blood and a drop in oxygen levels. ?OHS can increase the risk for heart failure, pulmonary hypertension, disability, and death. ?Follow your diet and exercise plan as told by your health care provider. ?Get help right away if you have any symptoms of a stroke. ?This information is not intended to replace advice given to you by your health care provider. Make sure you discuss any questions you have with your health care provider. ?Document Revised: 12/08/2020 Document Reviewed: 12/08/2020 ?Elsevier Patient Education ? 2022 [employer] ? ?

## 2021-07-28 NOTE — Progress Notes (Signed)
? ? ?SLEEP MEDICINE CLINIC ?  ? ?Provider:  Melvyn Novasarmen  Vanna Shavers, MD  ?Primary Care Physician:  Dorothyann PengSanders, Robyn, MD ?[address] ?[place] [phone]  ? ?  ?Referring Provider: Dorothyann PengSanders, Robyn, Md ?[address] ?[address] ?[place]  [place] [id number]  ?  ?  ?    ?Chief Complaint according to patient   ?Patient presents with:  ?  ? New Patient (Initial Visit)  ?   Sarah Barton. Presents today to re-eval sleep. She avg about 8-9 hrs but wakes up during the night. She had a SS in 2017 and was unable to start therapy due to expense. Prior to that was one machine that was recalled. Currently not using any treatment.   ?  ?  ?HISTORY OF PRESENT ILLNESS:  ?Sarah BarerShenita Barton is a 41  year old   [place] female patient of Dr Allyne GeeSanders, who is seen on 07-28-2021 upon request for a sleep consultation.  ?referral on 07/28/2021   ?Chief concern according to patient :  " I spent 8-9 in bed but I sleep only about 5 hours, my sleep is so broken ". ?  ?I Sarah Barton  has a past medical history of Angio-edema, Depression, Dysmetabolic syndrome, Hypertension, Hypothyroidism, / Obesity hypoventilation, Super Obesity Morbid obesity (HCC), Thyroid disease, and Urticaria. Her BMI has risen from 75 to now 6185 .  ?  ?The patient had the first sleep study in the year 2017  with a result of an AHI ( Apnea Hypopnea index)of 44.2.  Time below 89% oxygen saturation equaled only 5 minutes and her oxygen nadir was 83%.  There were no periodic limb movements and her EKG was in normal sinus rhythm.  She was titrated from 5 to a final pressure of 15 cm water.  An AHI of 0.0 was reached at the final pressure and she used an AirFit P10 so-called nasal pillow and medium size during the night.  This worked well for her she is not an excessive snorer.  He actually can breathe through the nose . ? ?sleep relevant medical history: Nocturia frequent 3-6 times, hypothyroidism.  ?  ?Social history:  Patient is working as a Runner, broadcasting/film/videoteacher , she teaches pre K-  and lives  in a household alone. The patient currently works. ?Tobacco use;never .  ETOH use ; rare,  ?Caffeine intake in form of Coffee( 1-2 week ) Soda( /) Tea ( 1-2 week) or energy drinks. ? ?  ?  ?Sleep habits are as follows: The patient's dinner time is between 6-7  PM.  ?The patient goes to bed at 9.30 PM and continues to sleep for intervals of 90-120 minutes- wakes for many bathroom breaks.   ?The preferred sleep position is reclined- , with the support of 2 pillows.  ?Dreams are reportedly frequent.  ? 6.15 AM is the usual rise time. The patient wakes up spontaneously before her alarm. She reports not feeling refreshed or restored in AM, with symptoms such as dry mouth, rare morning headaches, and residual fatigue.  ?Naps are taken infrequently, lasting from 30 to 45 minutes and are refreshing .  ?  ?Review of Systems: ?Out of a complete 14 system review, the patient complains of only the following symptoms, and all other reviewed systems are negative.:  ?Fatigue, sleepiness , snoring, fragmented sleep, Insomnia / fragmented sleep  ?  ?How likely are you to doze in the following situations: ?0 = not likely, 1 = slight chance, 2 = moderate chance, 3 =  high chance ?  ?Sitting and Reading? ?Watching Television? ?Sitting inactive in a public place (theater or meeting)? ?As a passenger in a car for an hour without a break? ?Lying down in the afternoon when circumstances permit? ?Sitting and talking to someone? ?Sitting quietly after lunch without alcohol? ?In a car, while stopped for a few minutes in traffic? ?  ?Total = 10-12/ 24 points  ? FSS endorsed at 29/ 63 points.  ? ?Social History  ? ?Socioeconomic History  ? Marital status: Single  ?  Spouse name: Not on file  ? Number of children: Not on file  ? Years of education: Not on file  ? Highest education level: Engineer, maintenance (IT) , early childhood educator. bachelor  ?Occupational History  ? Not on file  ?Tobacco Use  ? Smoking status: Never  ? Smokeless tobacco: Never   ?Vaping Use  ? Vaping Use: Never used  ?Substance and Sexual Activity  ? Alcohol use: Yes  ?  Comment: rarely   ? Drug use: No  ? Sexual activity: Never  ?  Birth control/protection: Abstinence  ?Other Topics Concern  ? Not on file  ?Social History Narrative  ? Not on file  ? ?Social Determinants of Health  ? ?Financial Resource Strain: Not on file  ?Food Insecurity: Not on file  ?Transportation Needs: Not on file  ?Physical Activity: Not on file  ?Stress: Not on file  ?Social Connections: Not on file  ? ? ?Family History  ?Problem Relation Age of Onset  ? Hypertension Mother   ? Migraines Mother   ? Anemia Mother   ? Asthma Mother   ? Cancer Maternal Grandfather   ?     Colon  ? Cancer Paternal Grandmother   ?     Breast ?Paternal grandfather with pancreatic cancer.   ? Diabetes Paternal Uncle   ? Kidney disease Paternal Uncle   ? Diabetes Paternal Aunt   ? Migraines Maternal Aunt   ? Sarcoidosis Father   ? Gout Father   ? Prostate cancer Father   ? ? ?Past Medical History:  ?Diagnosis Date  ? Angio-edema   ? Depression   ? Dysmetabolic syndrome   ? Hypertension   ? Hypothyroidism   ? Morbid obesity (HCC)   ? Obesity, morbid (HCC)   ? Sciatica   ? Thyroid disease   ? Urticaria   ? ? ?Past Surgical History:  ?Procedure Laterality Date  ? WISDOM TOOTH EXTRACTION    ?  ? ?Current Outpatient Medications on File Prior to Visit  ?Medication Sig Dispense Refill  ? albuterol (VENTOLIN HFA) 108 (90 Base) MCG/ACT inhaler Inhale 2 puffs into the lungs every 6 (six) hours as needed for wheezing or shortness of breath. 18 g 1  ? ALPRAZolam (XANAX) 1 MG tablet     ? EPINEPHrine 0.3 mg/0.3 mL IJ SOAJ injection Inject 0.3 mLs (0.3 mg total) into the muscle as needed for anaphylaxis. 2 each 3  ? levothyroxine (SYNTHROID) 150 MCG tablet TAKE ONE TABLET BY MOUTH MONDAY- FRIDAY. TAKE SATURDAY AND SUNDAY. (Patient taking differently: Take 150 mcg by mouth daily before breakfast. TAKE ONE TABLET BY MOUTH MONDAY- FRIDAY. TAKE  SATURDAY AND SUNDAY.) 60 tablet 5  ? rosuvastatin (CRESTOR) 20 MG tablet TAKE 1 TABLET BY MOUTH EVERYDAY AT BEDTIME 90 tablet 2  ? sertraline (ZOLOFT) 100 MG tablet Take 100 mg by mouth daily. 2 tabs daily    ? valsartan (DIOVAN) 80 MG tablet Take 1 tablet (80  mg total) by mouth daily. 90 tablet 1  ? Vitamin D, Ergocalciferol, (DRISDOL) 1.25 MG (50000 UNIT) CAPS capsule TAKE 1 CAPSULE TWICE WEEKLY ON TUESDAYS/FRIDAYS 24 capsule 0  ? ?No current facility-administered medications on file prior to visit.  ? ? ?Most recent blood tests were from 01 July 2021 and dedicated to thyroid function. ?T3 free hormone was at 2.5 in normal range. Free T4  was 1.28 , normal.  ?TSH was elevated at 7.69 it was only briefly in normal range about 8 months ago and has otherwise been always elevated for the last 2 years. ?WBC 3.4 - 10.8 x10E3/uL 8.6  8.7  7.0  6.5  7.8 R  10.1 R   ?RBC 3.77 - 5.28 x10E6/uL 4.94  4.90  5.15  4.56  4.81 R  5.08 R   ?Hemoglobin 11.1 - 15.9 g/dL 56.4  33.2  95.1  88.4  13.3 R  13.8 R   ?Hematocrit 34.0 - 46.6 % 41.7  41.3  43.8  37.8  40.9 R  41.8 R   ?MCV 79 - 97 fL 84  84  85  83  85.0 R  82.3 R   ?MCH 26.6 - 33.0 pg 27.1  28.0  26.8  27.2  27.7 R  27.2 R   ?MCHC 31.5 - 35.7 g/dL 16.6  06.3  01.6  01.0  32.5 R  33.0 R   ?RDW 11.7 - 15.4 % 13.9  14.3  14.6  15.2  14.2 R  14.4 R   ?Platelets 150 - 450 x10E3/uL 265  245  295  257  244 R  281 R   ? ?Component Ref Range & Units 3 mo ago 10 mo ago 1 yr ago 2 yr ago 3 yr ago  ?Hgb A1c MFr Bld 4.8 - 5.6 % 5.8 High   5.7 High  CM  5.7 High  CM  5.6 CM  5.8 High  CM   ?Comment:          Prediabetes: 5.7 - 6.4  ?         Diabetes: >6.4   ? ? ?Allergies  ?Allergen Reactions  ? Ace Inhibitors Swelling  ? ? ?Physical exam: ? ?Today's Vitals  ? 07/28/21 1445  ?BP: (!) 155/91  ?Pulse: (!) 140  ?Weight: (!) 469 lb (212.7 kg)  ?Height: 5\' 2"  (1.575 m)  ? ?Body mass index is 85.78 kg/m?.  ? ?Wt Readings from Last 3 Encounters:  ?07/28/21 (!) 469 lb (212.7 kg)   ?06/30/21 (!) 472 lb 12.8 oz (214.5 kg)  ?04/01/21 (!) 467 lb 12.8 oz (212.2 kg)  ?  ? ?Ht Readings from Last 3 Encounters:  ?07/28/21 5\' 2"  (1.575 m)  ?06/30/21 5' 1.8" (1.57 m)  ?04/01/21 5' 1.8" (1.57 m)  ?

## 2021-08-02 DIAGNOSIS — M778 Other enthesopathies, not elsewhere classified: Secondary | ICD-10-CM | POA: Diagnosis not present

## 2021-08-02 DIAGNOSIS — M170 Bilateral primary osteoarthritis of knee: Secondary | ICD-10-CM | POA: Diagnosis not present

## 2021-08-03 DIAGNOSIS — M7712 Lateral epicondylitis, left elbow: Secondary | ICD-10-CM | POA: Diagnosis not present

## 2021-08-03 DIAGNOSIS — M1712 Unilateral primary osteoarthritis, left knee: Secondary | ICD-10-CM | POA: Diagnosis not present

## 2021-08-09 ENCOUNTER — Other Ambulatory Visit (HOSPITAL_COMMUNITY): Payer: Self-pay

## 2021-08-18 ENCOUNTER — Ambulatory Visit (INDEPENDENT_AMBULATORY_CARE_PROVIDER_SITE_OTHER): Payer: BC Managed Care – PPO | Admitting: Neurology

## 2021-08-18 DIAGNOSIS — G4733 Obstructive sleep apnea (adult) (pediatric): Secondary | ICD-10-CM | POA: Diagnosis not present

## 2021-08-18 DIAGNOSIS — G478 Other sleep disorders: Secondary | ICD-10-CM

## 2021-08-18 DIAGNOSIS — Z87898 Personal history of other specified conditions: Secondary | ICD-10-CM

## 2021-08-18 DIAGNOSIS — G4719 Other hypersomnia: Secondary | ICD-10-CM

## 2021-08-23 NOTE — Progress Notes (Signed)
?  ?  ?[facility] ?  ?HOME SLEEP TEST REPORT ( by Watch PAT)   ?STUDY DATA:  08-23-2021 ? ?  ?ORDERING CLINICIAN: Carmen Dohmeier, MD  ?REFERRING CLINICIAN: Dr. Robyn Sanders, MD ?  ?CLINICAL INFORMATION/HISTORY: 07/28/2021   ?Chief concern according to patient :  " I spent 8-9 in bed but I sleep only about 5 hours, my sleep is so broken ". ?  ?I Sarah Barton  has a past medical history of Angio-edema, Asthma on albuterol, SOB, Depression, Dysmetabolic syndrome, Hypertension, Hypothyroidism, / Obesity Hypoventilation, Super Obesity (HCC) with orthopnea, Thyroid disease, Prediabetes, Nocturia, Snoring, and Urticaria. Her BMI has risen from 75 to now 85kg/m2. She sleeps reclined.  ?  ?The patient had the first sleep study in the year 2017  with a result of an AHI ( Apnea Hypopnea index)of 44.2. Time below 89% oxygen saturation equaled only 5 minutes and her oxygen nadir was 83%.  There were no periodic limb movements and her EKG was in normal sinus rhythm.  She was titrated from 5 to a final pressure of 15 cm water.  An AHI of 0.0 was reached at the final pressure and she used an AirFit P10 so-called nasal pillow and medium size during the night.  ?She could not follow up with CPAP therapy due to costs. She remains frustrated about her progressive weight gain, and was tearful.  ?  ?Epworth sleepiness score: 12 /24. FSS at 29/ 63 points. ?BMI:86.4 kg/m? ?Neck Circumference: 17" ?  ?FINDINGS: ?  ?Sleep Summary: ?  ?Total Recording Time (hours, min): Total recording time for this home sleep test was 7 hours and 18 minutes which the calculated sleep time was 5 hours 55 minutes.  14.9% REM sleep were indicated.    ?  ?Respiratory Indices: ?  ?Calculated pAHI (per hour):     9.0/h                      ?  ?REM pAHI:   14.7/h                                            ?NREM pAHI: 8.0/h ?The patient reached an RDI of 19.2 during REM sleep indicating that she was loudly snoring.                          ?  ?Positional  AHI:    The patient slept mostly prone according to the chest wall electrode recording and this was 240 minutes in prone position with an RDI of 6.5/h and  AHI of 5.5/h.   ?Supine sleep was recorded for 408 minutes with an AHI of 16.2/h. ? ?The mean volume of snoring was 43 dB and snoring was intermittently present for 36% of the total sleep time and reached at times over 60 dB (which is loud).                                             ?  ?Oxygen Saturation Statistics: ?   ?O2 Saturation Range (%):  Between a nadir of 86% saturation and a maximum saturation of 99% with a mean value of 93%                                  ?  ?  O2 Saturation (minutes) <89%:  0.7 minutes only.      ?  ?Pulse Rate Statistics:              ?  ?Pulse Range:     Between 63 and 111 bpm with a mean heart rate of 80 bpm. ?Please note that a home sleep test cannot indicate cardiac rhythm abnormalities.          ?  ?IMPRESSION:  This HST confirms the presence of mild sleep apnea.  The patient sleeps at home in a recliner or in reclined position which may have influenced the AHI.  She did not have significant hypoxemia.  She did have loud snoring intermittently. ?Supine sleep clearly accentuated her apnea and her apnea is also very much REM sleep dependent. ?  ?RECOMMENDATION: I do recommend to start CPAP we can use an auto titration device between 5 and 15 cmH2O to centimeter EPR and she can use a medium size nasal pillow such as a [place] Swift if needed with chinstrap.  If she feels comfortable using a full facemask she is welcome to it.  She does not have nasal patency issues. ? ?  ?INTERPRETING PHYSICIAN: ? ? Carmen Dohmeier, MD  ? ?Medical Director of [facility].  ? ? ? ? ? ? ? ? ? ? ? ? ? ? ? ? ? ? ? ?

## 2021-08-26 ENCOUNTER — Telehealth: Payer: Self-pay

## 2021-08-26 NOTE — Telephone Encounter (Signed)
-----   Message from Melvyn Novas, MD sent at 08/26/2021  1:48 PM EDT ----- ?IMPRESSION:  This HST confirms the presence of mild sleep apnea.  The patient sleeps at home in a recliner or in reclined position which may have influenced the AHI.  She did not have significant hypoxemia.  She did have loud snoring intermittently. ?Supine sleep clearly accentuated her apnea and her apnea is also very much REM sleep dependent. ?? ?RECOMMENDATION: I do recommend to start CPAP, she can use an auto titration device between 6 and 16 cmH2O with 3 centimeter EPR and she can use a medium size nasal pillow such as a [employer] Officer- if needed with chinstrap.  If she feels comfortable using a full facemask she is welcome to it.  She does not have nasal patency issues. ResMed device, please ?

## 2021-08-26 NOTE — Telephone Encounter (Signed)
I called pt. I advised pt that Dr. Vickey Huger  reviewed their sleep study results and found that pt has mild osa. Dr. Vickey Huger recommends that pt start on pap machine for treatment. I reviewed PAP compliance expectations with the pt. Pt is agreeable to starting an auto-PAP. I advised pt that an order will be sent to a DME, [facility], and [facility] will call the pt within about one week after they file with the pt's insurance. [facility] will show the pt how to use the machine, fit for masks, and troubleshoot the auto-PAP if needed. A follow up appt was made for insurance purposes with Dr. Vickey Huger on 11/08/2021 at 1030 am. Pt verbalized understanding to arrive 15 minutes early and bring their auto-PAP. A letter with all of this information in it will be mailed to the pt as a reminder. I verified with the pt that the address we have on file is correct. Pt verbalized understanding of results. Pt had no questions at this time but was encouraged to call back if questions arise. I have sent the order to [facility] and have received confirmation that they have received the order. ? ?

## 2021-08-26 NOTE — Addendum Note (Signed)
Addended by: Melvyn Novas on: 08/26/2021 01:48 PM ? ? Modules accepted: Orders ? ?

## 2021-08-26 NOTE — Procedures (Signed)
?  ?  ?[facility] ?  ?HOME SLEEP TEST REPORT ( by Watch PAT)   ?STUDY DATA:  08-23-2021 ? ?  ?ORDERING CLINICIAN: Larey Seat, MD  ?REFERRING CLINICIAN: Dr. Glendale Chard, MD ?  ?CLINICAL INFORMATION/HISTORY: 07/28/2021   ?Chief concern according to patient :  " I spent 8-9 in bed but I sleep only about 5 hours, my sleep is so broken ". ?  ?I Sarah Barton  has a past medical history of Angio-edema, Asthma on albuterol, SOB, Depression, Dysmetabolic syndrome, Hypertension, Hypothyroidism, / Obesity Hypoventilation, Super Obesity ([place]) with orthopnea, Thyroid disease, Prediabetes, Nocturia, Snoring, and Urticaria. Her BMI has risen from 75 to now 85kg/m2. She sleeps reclined.  ?  ?The patient had the first sleep study in the year 2017  with a result of an AHI ( Apnea Hypopnea index)of 44.2. Time below 89% oxygen saturation equaled only 5 minutes and her oxygen nadir was 83%.  There were no periodic limb movements and her EKG was in normal sinus rhythm.  She was titrated from 5 to a final pressure of 15 cm water.  An AHI of 0.0 was reached at the final pressure and she used an AirFit P10 so-called nasal pillow and medium size during the night.  ?She could not follow up with CPAP therapy due to costs. She remains frustrated about her progressive weight gain, and was tearful.  ?  ?Epworth sleepiness score: 12 /24. FSS at 29/ 63 points. ?BMI:86.4 kg/m? ?Neck Circumference: 17" ?  ?FINDINGS: ?  ?Sleep Summary: ?  ?Total Recording Time (hours, min): Total recording time for this home sleep test was 7 hours and 18 minutes which the calculated sleep time was 5 hours 55 minutes.  14.9% REM sleep were indicated.    ?  ?Respiratory Indices: ?  ?Calculated pAHI (per hour):     9.0/h                      ?  ?REM pAHI:   14.7/h                                            ?NREM pAHI: 8.0/h ?The patient reached an RDI of 19.2 during REM sleep indicating that she was loudly snoring.                          ?  ?Positional  AHI:    The patient slept mostly prone according to the chest wall electrode recording and this was 240 minutes in prone position with an RDI of 6.5/h and  AHI of 5.5/h.   ?Supine sleep was recorded for 408 minutes with an AHI of 16.2/h. ? ?The mean volume of snoring was 43 dB and snoring was intermittently present for 36% of the total sleep time and reached at times over 60 dB (which is loud).                                             ?  ?Oxygen Saturation Statistics: ?   ?O2 Saturation Range (%):  Between a nadir of 86% saturation and a maximum saturation of 99% with a mean value of 93%                                  ?  ?  O2 Saturation (minutes) <89%:  0.7 minutes only.      ?  ?Pulse Rate Statistics:              ?  ?Pulse Range:     Between 63 and 111 bpm with a mean heart rate of 80 bpm. ?Please note that a home sleep test cannot indicate cardiac rhythm abnormalities.          ?  ?IMPRESSION:  This HST confirms the presence of mild sleep apnea.  The patient sleeps at home in a recliner or in reclined position which may have influenced the AHI.  She did not have significant hypoxemia.  She did have loud snoring intermittently. ?Supine sleep clearly accentuated her apnea and her apnea is also very much REM sleep dependent. ?  ?RECOMMENDATION: I do recommend to start CPAP we can use an auto titration device between 5 and 15 cmH2O to centimeter EPR and she can use a medium size nasal pillow such as a [place] Swift if needed with chinstrap.  If she feels comfortable using a full facemask she is welcome to it.  She does not have nasal patency issues. ? ?  ?INTERPRETING PHYSICIAN: ? ? Larey Seat, MD  ? ?Medical Director of [facility].  ? ? ? ? ? ? ? ? ? ? ? ? ? ? ? ? ? ? ? ?

## 2021-08-26 NOTE — Progress Notes (Signed)
IMPRESSION:  This HST confirms the presence of mild sleep apnea.  The patient sleeps at home in a recliner or in reclined position which may have influenced the AHI.  She did not have significant hypoxemia.  She did have loud snoring intermittently. ?Supine sleep clearly accentuated her apnea and her apnea is also very much REM sleep dependent. ?? ?RECOMMENDATION: I do recommend to start CPAP, she can use an auto titration device between 6 and 16 cmH2O with 3 centimeter EPR and she can use a medium size nasal pillow such as a Chief Executive Officer- if needed with chinstrap.  If she feels comfortable using a full facemask she is welcome to it.  She does not have nasal patency issues. ResMed device, please

## 2021-08-31 ENCOUNTER — Encounter: Payer: Self-pay | Admitting: Internal Medicine

## 2021-08-31 ENCOUNTER — Other Ambulatory Visit: Payer: Self-pay | Admitting: Internal Medicine

## 2021-08-31 ENCOUNTER — Other Ambulatory Visit: Payer: Self-pay

## 2021-08-31 MED ORDER — LEVOTHYROXINE SODIUM 150 MCG PO TABS: 150.0000 ug | ORAL_TABLET | Freq: Every day | ORAL | 1 refills | Status: DC

## 2021-09-28 ENCOUNTER — Encounter: Payer: BC Managed Care – PPO | Admitting: Internal Medicine

## 2021-10-13 ENCOUNTER — Encounter: Payer: Self-pay | Admitting: Neurology

## 2021-10-27 ENCOUNTER — Other Ambulatory Visit: Payer: Self-pay | Admitting: Internal Medicine

## 2021-11-01 DIAGNOSIS — G4733 Obstructive sleep apnea (adult) (pediatric): Secondary | ICD-10-CM | POA: Diagnosis not present

## 2021-11-01 DIAGNOSIS — I10 Essential (primary) hypertension: Secondary | ICD-10-CM | POA: Diagnosis not present

## 2021-11-10 ENCOUNTER — Ambulatory Visit: Payer: BC Managed Care – PPO | Admitting: Neurology

## 2021-11-17 ENCOUNTER — Telehealth: Payer: Self-pay | Admitting: Neurology

## 2021-11-17 NOTE — Telephone Encounter (Signed)
Pt was scheduled for Initial CPAP visit on 01/26/22.  Pt was informed to bring machine and power cord to appt. DME: [facility] Phone: [phone] Fax: [phone] Equipment issued: Lolita Cram 11 Auto on 01/26/22 Pt to be scheduled between: 12/02/21-02/01/22

## 2021-11-30 ENCOUNTER — Encounter: Payer: Self-pay | Admitting: Internal Medicine

## 2021-11-30 ENCOUNTER — Ambulatory Visit: Payer: BC Managed Care – PPO | Admitting: Internal Medicine

## 2021-11-30 VITALS — BP 150/100 | HR 111 | Temp 98.3°F | Ht 62.0 in | Wt >= 6400 oz

## 2021-11-30 DIAGNOSIS — M7989 Other specified soft tissue disorders: Secondary | ICD-10-CM

## 2021-11-30 DIAGNOSIS — I10 Essential (primary) hypertension: Secondary | ICD-10-CM

## 2021-11-30 DIAGNOSIS — Z6841 Body Mass Index (BMI) 40.0 and over, adult: Secondary | ICD-10-CM

## 2021-11-30 DIAGNOSIS — R7309 Other abnormal glucose: Secondary | ICD-10-CM | POA: Diagnosis not present

## 2021-11-30 DIAGNOSIS — E039 Hypothyroidism, unspecified: Secondary | ICD-10-CM | POA: Diagnosis not present

## 2021-11-30 LAB — POCT URINALYSIS DIPSTICK
Bilirubin, UA: NEGATIVE
Blood, UA: NEGATIVE
Glucose, UA: NEGATIVE
Ketones, UA: NEGATIVE
Leukocytes, UA: NEGATIVE
Nitrite, UA: NEGATIVE
Protein, UA: NEGATIVE
Spec Grav, UA: <=1.005 — AB (ref 1.010–1.025)
Urobilinogen, UA: 0.2 E.U./dL
pH, UA: 6 (ref 5.0–8.0)

## 2021-11-30 NOTE — Progress Notes (Signed)
Barnet Glasgow Martin,acting as a Education administrator for Maximino Greenland, MD.,have documented all relevant documentation on the behalf of Maximino Greenland, MD,as directed by  Maximino Greenland, MD while in the presence of Maximino Greenland, MD.    Subjective:     Patient ID: Sarah Barton , female    DOB: 02/18/1981 , 41 y.o.   MRN: [id number]   Chief Complaint  Patient presents with   Hypertension   Hypothyroidism    HPI  Patient presents today for a BP check and thyroid follow up. She reports compliance with meds. She is taking 173mg Synthroid daily. She has not had any issues with the medication. Patient has no other concerns.  BP Readings from Last 3 Encounters: 11/30/21 : (!) 150/100 07/28/21 : (!) 155/91 06/30/21 : 132/76    Hypertension This is a chronic problem. The current episode started more than 1 year ago. The problem has been gradually improving since onset. The problem is controlled. Pertinent negatives include no blurred vision, chest pain, orthopnea or shortness of breath. Risk factors for coronary artery disease include obesity and sedentary lifestyle. Past treatments include ACE inhibitors and diuretics. The current treatment provides moderate improvement. Compliance problems include exercise.      Past Medical History:  Diagnosis Date   Angio-edema    Depression    Dysmetabolic syndrome    Hypertension    Hypothyroidism    Morbid obesity ([place]    Obesity, morbid ([place]    Sciatica    Thyroid disease    Urticaria      Family History  Problem Relation Age of Onset   Hypertension Mother    Migraines Mother    Anemia Mother    Asthma Mother    Cancer Maternal Grandfather        Colon   Cancer Paternal Grandmother        Breast   Diabetes Paternal Uncle    Kidney disease Paternal Uncle    Diabetes Paternal Aunt    Migraines Maternal Aunt    Sarcoidosis Father    Gout Father    Prostate cancer Father      Current Outpatient Medications:    albuterol (VENTOLIN  HFA) 108 (90 Base) MCG/ACT inhaler, Inhale 2 puffs into the lungs every 6 (six) hours as needed for wheezing or shortness of breath., Disp: 18 g, Rfl: 1   EPINEPHrine 0.3 mg/0.3 mL IJ SOAJ injection, Inject 0.3 mLs (0.3 mg total) into the muscle as needed for anaphylaxis., Disp: 2 each, Rfl: 3   levothyroxine (SYNTHROID) 150 MCG tablet, Take 1 tablet (150 mcg total) by mouth daily before breakfast. TAKE ONE TABLET BY MOUTH MONDAY- FRIDAY. TAKE 137MCG SATURDAY AND SUNDAY., Disp: 90 tablet, Rfl: 1   rosuvastatin (CRESTOR) 20 MG tablet, TAKE 1 TABLET BY MOUTH EVERYDAY AT BEDTIME, Disp: 90 tablet, Rfl: 2   sertraline (ZOLOFT) 100 MG tablet, Take 100 mg by mouth daily. 2 tabs daily, Disp: , Rfl:    valsartan (DIOVAN) 80 MG tablet, Take 1 tablet (80 mg total) by mouth daily., Disp: 90 tablet, Rfl: 1   Vitamin D, Ergocalciferol, (DRISDOL) 1.25 MG (50000 UNIT) CAPS capsule, TAKE 1 CAPSULE TWICE WEEKLY ON TUESDAYS/FRIDAYS, Disp: 24 capsule, Rfl: 0   Allergies  Allergen Reactions   Ace Inhibitors Swelling     Review of Systems  Constitutional: Negative.   HENT: Negative.    Eyes: Negative.  Negative for blurred vision.  Respiratory: Negative.  Negative for shortness of breath.  Cardiovascular: Negative.  Negative for chest pain and orthopnea.  Gastrointestinal: Negative.   Musculoskeletal: Negative.   Skin: Negative.   Psychiatric/Behavioral: Negative.       Today's Vitals   11/30/21 1448 11/30/21 1449  BP: (!) 142/90 (!) 150/100  Pulse: (!) 111   Temp: 98.3 F (36.8 C)   TempSrc: Oral   Weight: (!) 468 lb 3.2 oz (212.4 kg)   Height: '5\' 2"'  (1.575 m)    Body mass index is 85.63 kg/m.  Wt Readings from Last 3 Encounters:  11/30/21 (!) 468 lb 3.2 oz (212.4 kg)  07/28/21 (!) 469 lb (212.7 kg)  06/30/21 (!) 472 lb 12.8 oz (214.5 kg)     Objective:  Physical Exam Vitals and nursing note reviewed.  Constitutional:      Appearance: Normal appearance. She is obese.  HENT:     Head:  Normocephalic and atraumatic.  Eyes:     Extraocular Movements: Extraocular movements intact.  Cardiovascular:     Rate and Rhythm: Normal rate and regular rhythm.     Heart sounds: Normal heart sounds.  Pulmonary:     Effort: Pulmonary effort is normal.     Breath sounds: Normal breath sounds.  Musculoskeletal:     Cervical back: Normal range of motion.  Skin:    General: Skin is warm.  Neurological:     General: No focal deficit present.     Mental Status: She is alert.  Psychiatric:        Mood and Affect: Mood normal.        Behavior: Behavior normal.      Assessment And Plan:     1. Essential hypertension Comments: Chronic, uncontrolled.  Repeat BP not yet at goal. Admits to recent non-compliance w/ meds. No changes today. Will have f/u nurse visit in 3 weeks.  - CMP14+EGFR - POCT Urinalysis Dipstick (81002) - Microalbumin / Creatinine Urine Ratio  2. Primary hypothyroidism Comments: I will check thyroid panel and adjust meds as needed. - TSH - T4, Free  3. Soft tissue mass Comments: She declines f/u MRI at this time. U/s results demonstrated ill-defined hypoechoic mass.   4. Other abnormal glucose Comments: Her a1c has been elevated in the past. I will recheck this today. She is encouraged to avoid sugary beverages, including diet drinks.  - Hemoglobin A1c  5. Class 3 severe obesity due to excess calories with serious comorbidity and body mass index (BMI) greater than or equal to 70 in adult [facility]) Comments: She is encouraged to aim for at least 150 minutes of exercise per week. Advised to do exercises in home due to the heat.    Patient was given opportunity to ask questions. Patient verbalized understanding of the plan and was able to repeat key elements of the plan. All questions were answered to their satisfaction.   I, Maximino Greenland, MD, have reviewed all documentation for this visit. The documentation on 11/30/21 for the exam, diagnosis, procedures, and  orders are all accurate and complete.   IF YOU HAVE BEEN REFERRED TO A SPECIALIST, IT MAY TAKE 1-2 WEEKS TO SCHEDULE/PROCESS THE REFERRAL. IF YOU HAVE NOT HEARD FROM US/SPECIALIST IN TWO WEEKS, PLEASE GIVE [place] A CALL AT [phone].   THE PATIENT IS ENCOURAGED TO PRACTICE SOCIAL DISTANCING DUE TO THE COVID-19 PANDEMIC.

## 2021-11-30 NOTE — Patient Instructions (Signed)
Hypertension, Adult ?Hypertension is another name for high blood pressure. High blood pressure forces your heart to work harder to pump blood. This can cause problems over time. ?There are two numbers in a blood pressure reading. There is a top number (systolic) over a bottom number (diastolic). It is best to have a blood pressure that is below 120/80. ?What are the causes? ?The cause of this condition is not known. Some other conditions can lead to high blood pressure. ?What increases the risk? ?Some lifestyle factors can make you more likely to develop high blood pressure: ?Smoking. ?Not getting enough exercise or physical activity. ?Being overweight. ?Having too much fat, sugar, calories, or salt (sodium) in your diet. ?Drinking too much alcohol. ?Other risk factors include: ?Having any of these conditions: ?Heart disease. ?Diabetes. ?High cholesterol. ?Kidney disease. ?Obstructive sleep apnea. ?Having a family history of high blood pressure and high cholesterol. ?Age. The risk increases with age. ?Stress. ?What are the signs or symptoms? ?High blood pressure may not cause symptoms. Very high blood pressure (hypertensive crisis) may cause: ?Headache. ?Fast or uneven heartbeats (palpitations). ?Shortness of breath. ?Nosebleed. ?Vomiting or feeling like you may vomit (nauseous). ?Changes in how you see. ?Very bad chest pain. ?Feeling dizzy. ?Seizures. ?How is this treated? ?This condition is treated by making healthy lifestyle changes, such as: ?Eating healthy foods. ?Exercising more. ?Drinking less alcohol. ?Your doctor may prescribe medicine if lifestyle changes do not help enough and if: ?Your top number is above 130. ?Your bottom number is above 80. ?Your personal target blood pressure may vary. ?Follow these instructions at home: ?Eating and drinking ? ?If told, follow the DASH eating plan. To follow this plan: ?Fill one half of your plate at each meal with fruits and vegetables. ?Fill one fourth of your plate  at each meal with whole grains. Whole grains include whole-wheat pasta, brown rice, and whole-grain bread. ?Eat or drink low-fat dairy products, such as skim milk or low-fat yogurt. ?Fill one fourth of your plate at each meal with low-fat (lean) proteins. Low-fat proteins include fish, chicken without skin, eggs, beans, and tofu. ?Avoid fatty meat, cured and processed meat, or chicken with skin. ?Avoid pre-made or processed food. ?Limit the amount of salt in your diet to less than 1,500 mg each day. ?Do not drink alcohol if: ?Your doctor tells you not to drink. ?You are pregnant, may be pregnant, or are planning to become pregnant. ?If you drink alcohol: ?Limit how much you have to: ?0-1 drink a day for women. ?0-2 drinks a day for men. ?Know how much alcohol is in your drink. In the [place] one drink equals one 12 oz bottle of beer (355 mL), one 5 oz glass of wine (148 mL), or one 1? oz glass of hard liquor (44 mL). ?Lifestyle ? ?Work with your doctor to stay at a healthy weight or to lose weight. Ask your doctor what the best weight is for you. ?Get at least 30 minutes of exercise that causes your heart to beat faster (aerobic exercise) most days of the week. This may include walking, swimming, or biking. ?Get at least 30 minutes of exercise that strengthens your muscles (resistance exercise) at least 3 days a week. This may include lifting weights or doing Pilates. ?Do not smoke or use any products that contain nicotine or tobacco. If you need help quitting, ask your doctor. ?Check your blood pressure at home as told by your doctor. ?Keep all follow-up visits. ?Medicines ?Take over-the-counter and prescription medicines   only as told by your doctor. Follow directions carefully. ?Do not skip doses of blood pressure medicine. The medicine does not work as well if you skip doses. Skipping doses also puts you at risk for problems. ?Ask your doctor about side effects or reactions to medicines that you should watch  for. ?Contact a doctor if: ?You think you are having a reaction to the medicine you are taking. ?You have headaches that keep coming back. ?You feel dizzy. ?You have swelling in your ankles. ?You have trouble with your vision. ?Get help right away if: ?You get a very bad headache. ?You start to feel mixed up (confused). ?You feel weak or numb. ?You feel faint. ?You have very bad pain in your: ?Chest. ?Belly (abdomen). ?You vomit more than once. ?You have trouble breathing. ?These symptoms may be an emergency. Get help right away. Call 911. ?Do not wait to see if the symptoms will go away. ?Do not drive yourself to the hospital. ?Summary ?Hypertension is another name for high blood pressure. ?High blood pressure forces your heart to work harder to pump blood. ?For most people, a normal blood pressure is less than 120/80. ?Making healthy choices can help lower blood pressure. If your blood pressure does not get lower with healthy choices, you may need to take medicine. ?This information is not intended to replace advice given to you by your health care provider. Make sure you discuss any questions you have with your health care provider. ?Document Revised: 02/18/2021 Document Reviewed: 02/18/2021 ?Elsevier Patient Education ? 2023 Else[employer] Inc. ? ?

## 2021-12-01 DIAGNOSIS — I10 Essential (primary) hypertension: Secondary | ICD-10-CM | POA: Diagnosis not present

## 2021-12-01 DIAGNOSIS — G4733 Obstructive sleep apnea (adult) (pediatric): Secondary | ICD-10-CM | POA: Diagnosis not present

## 2021-12-01 LAB — MICROALBUMIN / CREATININE URINE RATIO
Creatinine, Urine: 28.2 mg/dL
Microalb/Creat Ratio: <11 mg/g creat (ref 0–29)
Microalbumin, Urine: <3 ug/mL

## 2021-12-01 LAB — HEMOGLOBIN A1C
Est. average glucose Bld gHb Est-mCnc: 117 mg/dL
Hgb A1c MFr Bld: 5.7 % — ABNORMAL HIGH (ref 4.8–5.6)

## 2021-12-01 LAB — CMP14+EGFR
ALT: 16 IU/L (ref 0–32)
AST: 14 IU/L (ref 0–40)
Albumin/Globulin Ratio: 1.6 (ref 1.2–2.2)
Albumin: 4.4 g/dL (ref 3.9–4.9)
Alkaline Phosphatase: 68 IU/L (ref 44–121)
BUN/Creatinine Ratio: 10 (ref 9–23)
BUN: 7 mg/dL (ref 6–24)
Bilirubin Total: 0.6 mg/dL (ref 0.0–1.2)
CO2: 25 mmol/L (ref 20–29)
Calcium: 10 mg/dL (ref 8.7–10.2)
Chloride: 98 mmol/L (ref 96–106)
Creatinine, Ser: 0.71 mg/dL (ref 0.57–1.00)
Globulin, Total: 2.8 g/dL (ref 1.5–4.5)
Glucose: 111 mg/dL — ABNORMAL HIGH (ref 70–99)
Potassium: 4.8 mmol/L (ref 3.5–5.2)
Sodium: 139 mmol/L (ref 134–144)
Total Protein: 7.2 g/dL (ref 6.0–8.5)
eGFR: 108 mL/min/{1.73_m2} (ref >59)

## 2021-12-01 LAB — TSH: TSH: 1.61 u[IU]/mL (ref 0.450–4.500)

## 2021-12-01 LAB — T4, FREE: Free T4: 1.69 ng/dL (ref 0.82–1.77)

## 2021-12-21 DIAGNOSIS — G4733 Obstructive sleep apnea (adult) (pediatric): Secondary | ICD-10-CM | POA: Diagnosis not present

## 2021-12-21 DIAGNOSIS — R400 Somnolence: Secondary | ICD-10-CM | POA: Diagnosis not present

## 2021-12-23 ENCOUNTER — Other Ambulatory Visit: Payer: Self-pay

## 2021-12-23 MED ORDER — LEVOTHYROXINE SODIUM 150 MCG PO TABS: 150.0000 ug | ORAL_TABLET | Freq: Every day | ORAL | 1 refills | Status: DC

## 2022-01-01 DIAGNOSIS — I10 Essential (primary) hypertension: Secondary | ICD-10-CM | POA: Diagnosis not present

## 2022-01-01 DIAGNOSIS — G4733 Obstructive sleep apnea (adult) (pediatric): Secondary | ICD-10-CM | POA: Diagnosis not present

## 2022-01-18 DIAGNOSIS — F411 Generalized anxiety disorder: Secondary | ICD-10-CM | POA: Diagnosis not present

## 2022-01-18 DIAGNOSIS — F332 Major depressive disorder, recurrent severe without psychotic features: Secondary | ICD-10-CM | POA: Diagnosis not present

## 2022-01-23 ENCOUNTER — Other Ambulatory Visit: Payer: Self-pay | Admitting: Internal Medicine

## 2022-01-26 ENCOUNTER — Encounter: Payer: Self-pay | Admitting: Neurology

## 2022-01-26 ENCOUNTER — Ambulatory Visit: Payer: BC Managed Care – PPO | Admitting: Neurology

## 2022-02-22 ENCOUNTER — Other Ambulatory Visit: Payer: Self-pay | Admitting: *Deleted

## 2022-02-22 DIAGNOSIS — G4733 Obstructive sleep apnea (adult) (pediatric): Secondary | ICD-10-CM

## 2022-02-22 NOTE — Progress Notes (Signed)
Received fax from [place] that pt did not meet initial compliance for her cpap machine. Resent restart script as requested to them at fax [phone]. Received fax confirmation.

## 2022-03-30 DIAGNOSIS — G4733 Obstructive sleep apnea (adult) (pediatric): Secondary | ICD-10-CM | POA: Diagnosis not present

## 2022-03-30 DIAGNOSIS — R400 Somnolence: Secondary | ICD-10-CM | POA: Diagnosis not present

## 2022-04-05 ENCOUNTER — Encounter: Payer: Self-pay | Admitting: Internal Medicine

## 2022-04-05 ENCOUNTER — Ambulatory Visit (INDEPENDENT_AMBULATORY_CARE_PROVIDER_SITE_OTHER): Payer: BC Managed Care – PPO | Admitting: Internal Medicine

## 2022-04-05 VITALS — BP 138/72 | HR 102 | Temp 98.1°F | Ht 62.0 in | Wt >= 6400 oz

## 2022-04-05 DIAGNOSIS — M7989 Other specified soft tissue disorders: Secondary | ICD-10-CM

## 2022-04-05 DIAGNOSIS — Z0000 Encounter for general adult medical examination without abnormal findings: Secondary | ICD-10-CM

## 2022-04-05 DIAGNOSIS — R7309 Other abnormal glucose: Secondary | ICD-10-CM

## 2022-04-05 DIAGNOSIS — E559 Vitamin D deficiency, unspecified: Secondary | ICD-10-CM

## 2022-04-05 DIAGNOSIS — E039 Hypothyroidism, unspecified: Secondary | ICD-10-CM | POA: Diagnosis not present

## 2022-04-05 DIAGNOSIS — Z23 Encounter for immunization: Secondary | ICD-10-CM

## 2022-04-05 DIAGNOSIS — I10 Essential (primary) hypertension: Secondary | ICD-10-CM

## 2022-04-05 DIAGNOSIS — Z6841 Body Mass Index (BMI) 40.0 and over, adult: Secondary | ICD-10-CM

## 2022-04-05 NOTE — Patient Instructions (Signed)

## 2022-04-05 NOTE — Progress Notes (Signed)
Barnet Glasgow Martin,acting as a Education administrator for Maximino Greenland, MD.,have documented all relevant documentation on the behalf of Maximino Greenland, MD,as directed by  Maximino Greenland, MD while in the presence of Maximino Greenland, MD.   Subjective:     Patient ID: Sarah Barton , female    DOB: Feb 27, 1981 , 41 y.o.   MRN: [id number]   Chief Complaint  Patient presents with   Annual Exam   Hypertension    HPI  Patient presents today for a HM.  She is followed by GYN for her pelvic exams.  She reports compliance with medication and has no other concerns at this time. She denies having any headaches, chest pain and shortness of breath.   BP Readings from Last 3 Encounters: 04/05/22 : (!) 140/72 11/30/21 : (!) 150/100 07/28/21 : (!) 155/91    Hypertension This is a chronic problem. The current episode started more than 1 year ago. The problem has been gradually improving since onset. The problem is controlled. Pertinent negatives include no blurred vision, chest pain, orthopnea or shortness of breath. Risk factors for coronary artery disease include obesity and sedentary lifestyle. Past treatments include ACE inhibitors and diuretics. The current treatment provides moderate improvement. Compliance problems include exercise.      Past Medical History:  Diagnosis Date   Angio-edema    Depression    Dysmetabolic syndrome    Hypertension    Hypothyroidism    Morbid obesity ([place])    Obesity, morbid ([place])    Sciatica    Thyroid disease    Urticaria      Family History  Problem Relation Age of Onset   Hypertension Mother    Migraines Mother    Anemia Mother    Asthma Mother    Cancer Maternal Grandfather        Colon   Cancer Paternal Grandmother        Breast   Diabetes Paternal Uncle    Kidney disease Paternal Uncle    Diabetes Paternal Aunt    Migraines Maternal Aunt    Sarcoidosis Father    Gout Father    Prostate cancer Father      Current Outpatient Medications:     albuterol (VENTOLIN HFA) 108 (90 Base) MCG/ACT inhaler, Inhale 2 puffs into the lungs every 6 (six) hours as needed for wheezing or shortness of breath., Disp: 18 g, Rfl: 1   EPINEPHrine 0.3 mg/0.3 mL IJ SOAJ injection, Inject 0.3 mLs (0.3 mg total) into the muscle as needed for anaphylaxis., Disp: 2 each, Rfl: 3   levothyroxine (SYNTHROID) 150 MCG tablet, Take 1 tablet (150 mcg total) by mouth daily before breakfast., Disp: 90 tablet, Rfl: 1   rosuvastatin (CRESTOR) 20 MG tablet, TAKE 1 TABLET BY MOUTH EVERYDAY AT BEDTIME, Disp: 90 tablet, Rfl: 2   sertraline (ZOLOFT) 100 MG tablet, Take 100 mg by mouth daily. 2 tabs daily, Disp: , Rfl:    valsartan (DIOVAN) 80 MG tablet, TAKE 1 TABLET BY MOUTH EVERY DAY, Disp: 90 tablet, Rfl: 1   Vitamin D, Ergocalciferol, (DRISDOL) 1.25 MG (50000 UNIT) CAPS capsule, One capsule po qweekly, Disp: 12 capsule, Rfl: 1   Allergies  Allergen Reactions   Ace Inhibitors Swelling      The patient states she uses none for birth control. Last LMP was Patient's last menstrual period was 10/30/2021.. Negative for Dysmenorrhea. Negative for: breast discharge, breast lump(s), breast pain and breast self exam. Associated symptoms include abnormal vaginal bleeding. Pertinent  negatives include abnormal bleeding (hematology), anxiety, decreased libido, depression, difficulty falling sleep, dyspareunia, history of infertility, nocturia, sexual dysfunction, sleep disturbances, urinary incontinence, urinary urgency, vaginal discharge and vaginal itching. Diet regular.The patient states her exercise level is  intermittent.  . The patient's tobacco use is:  Social History   Tobacco Use  Smoking Status Never  Smokeless Tobacco Never  . She has been exposed to passive smoke. The patient's alcohol use is:  Social History   Substance and Sexual Activity  Alcohol Use Yes   Comment: rarely    Review of Systems  Constitutional: Negative.   HENT: Negative.    Eyes: Negative.   Negative for blurred vision.  Respiratory: Negative.  Negative for shortness of breath.   Cardiovascular: Negative.  Negative for chest pain and orthopnea.  Gastrointestinal: Negative.   Endocrine: Negative.   Genitourinary: Negative.   Musculoskeletal: Negative.   Skin: Negative.   Allergic/Immunologic: Negative.   Neurological: Negative.   Hematological: Negative.   Psychiatric/Behavioral: Negative.       Today's Vitals   04/05/22 1510 04/05/22 1520  BP: (!) 140/72 138/72  Pulse: (!) 102   Temp: 98.1 F (36.7 C)   TempSrc: Oral   Weight: (!) 457 lb 6.4 oz (207.5 kg)   Height: _0  (1.575 m)    Body mass index is 83.66 kg/m.  Wt Readings from Last 3 Encounters:  04/05/22 (!) 457 lb 6.4 oz (207.5 kg)  11/30/21 (!) 468 lb 3.2 oz (212.4 kg)  07/28/21 (!) 469 lb (212.7 kg)    Objective:  Physical Exam Vitals and nursing note reviewed.  Constitutional:      Appearance: Normal appearance. She is obese.     Comments: Exam performed while seated in chair  HENT:     Head: Normocephalic and atraumatic.     Right Ear: Tympanic membrane, ear canal and external ear normal.     Left Ear: Tympanic membrane, ear canal and external ear normal.     Nose:     Comments: Masked     Mouth/Throat:     Comments: Masked  Eyes:     Extraocular Movements: Extraocular movements intact.     Conjunctiva/sclera: Conjunctivae normal.     Pupils: Pupils are equal, round, and reactive to light.  Cardiovascular:     Rate and Rhythm: Normal rate and regular rhythm.     Pulses: Normal pulses.     Heart sounds: Normal heart sounds.  Pulmonary:     Effort: Pulmonary effort is normal.     Breath sounds: Normal breath sounds.  Chest:  Breasts:    Tanner Score is 5.     Right: Normal.     Left: Normal.     Comments: Pendulous b/l Abdominal:     General: Bowel sounds are normal.     Palpations: Abdomen is soft.     Comments: Soft, obese, difficult to assess organomegaly  Genitourinary:     Comments: deferred Musculoskeletal:        General: Normal range of motion.     Cervical back: Normal range of motion and neck supple.     Comments: Soft tissue mass RLE  Skin:    General: Skin is warm and dry.  Neurological:     General: No focal deficit present.     Mental Status: She is alert and oriented to person, place, and time.  Psychiatric:        Mood and Affect: Mood normal.  Behavior: Behavior normal.       Assessment And Plan:     1. Annual physical exam Comments: A full exam was performed. Importance of monthly self breast exams was discussed with the patient. She will c/w GYN for pelvic exams.  PATIENT IS ADVISED TO GET 30-45 MINUTES REGULAR EXERCISE NO LESS THAN FOUR TO FIVE DAYS PER WEEK - BOTH WEIGHTBEARING EXERCISES AND AEROBIC ARE RECOMMENDED.  PATIENT IS ADVISED TO FOLLOW A HEALTHY DIET WITH AT LEAST SIX FRUITS/VEGGIES PER DAY, DECREASE INTAKE OF RED MEAT, AND TO INCREASE FISH INTAKE TO TWO DAYS PER WEEK.  MEATS/FISH SHOULD NOT BE FRIED, BAKED OR BROILED IS PREFERABLE.  IT IS ALSO IMPORTANT TO CUT BACK ON YOUR SUGAR INTAKE. PLEASE AVOID ANYTHING WITH ADDED SUGAR, CORN SYRUP OR OTHER SWEETENERS. IF YOU MUST USE A SWEETENER, YOU CAN TRY STEVIA. IT IS ALSO IMPORTANT TO AVOID ARTIFICIALLY SWEETENERS AND DIET BEVERAGES. LASTLY, I SUGGEST WEARING SPF 50 SUNSCREEN ON EXPOSED PARTS AND ESPECIALLY WHEN IN THE DIRECT SUNLIGHT FOR AN EXTENDED PERIOD OF TIME.  PLEASE AVOID FAST FOOD RESTAURANTS AND INCREASE YOUR WATER INTAKE. - CBC no Diff - CMP14+EGFR - Lipid panel - Hemoglobin A1c - TSH + free T4  2. Essential hypertension Comments: Chronic, fair control. Goal BP<13080. EKG performed, NSR w/o acute changes. Advised to follow low sodium diet.  She will c/w valsartan 41m daily for now. If elevated at next visit, will increase dose to 1617mdaily. Another option is to consider adding amlodipine 2.95m395mightly. I will schedule f/u in 3 months.  - EKG 12-Lead  3. Soft tissue  mass Comments: At this time, she does not wish to consider MRI RLE/ankle.  4. Primary hypothyroidism Comments: I will check thyroid panel and adjust meds as needed. She will f/u in six months.  5. Other abnormal glucose Comments: Her a1c has been elevated in the past. I will recheck an a1c today. She is encouraged to decrease her intake of sugary beverages and foods.  6. Vitamin D deficiency disease Comments: I iwll check vitamin D level and supplemnet as needed. - Vitamin D (25 hydroxy)  7. Class 3 severe obesity due to excess calories with serious comorbidity and body mass index (BMI) greater than or equal to 70 in adult [facility]: She does not wish to pursue bariatric surgery at this time. Encouraged to aim for at least 150 minutes of exercise per week.  8. Need for influenza vaccination - Flu Vaccine QUAD 6+ mos PF IM (Fluarix Quad PF)  Patient was given opportunity to ask questions. Patient verbalized understanding of the plan and was able to repeat key elements of the plan. All questions were answered to their satisfaction.   I, RobMaximino GreenlandD, have reviewed all documentation for this visit. The documentation on 04/05/22 for the exam, diagnosis, procedures, and orders are all accurate and complete.   THE PATIENT IS ENCOURAGED TO PRACTICE SOCIAL DISTANCING DUE TO THE COVID-19 PANDEMIC.

## 2022-04-06 ENCOUNTER — Other Ambulatory Visit: Payer: Self-pay | Admitting: Internal Medicine

## 2022-04-06 LAB — CBC
Hematocrit: 42.8 % (ref 34.0–46.6)
Hemoglobin: 13.9 g/dL (ref 11.1–15.9)
MCH: 27.2 pg (ref 26.6–33.0)
MCHC: 32.5 g/dL (ref 31.5–35.7)
MCV: 84 fL (ref 79–97)
Platelets: 292 10*3/uL (ref 150–450)
RBC: 5.11 x10E6/uL (ref 3.77–5.28)
RDW: 14 % (ref 11.7–15.4)
WBC: 8.2 10*3/uL (ref 3.4–10.8)

## 2022-04-06 LAB — CMP14+EGFR
ALT: 15 IU/L (ref 0–32)
AST: 20 IU/L (ref 0–40)
Albumin/Globulin Ratio: 1.4 (ref 1.2–2.2)
Albumin: 4.6 g/dL (ref 3.9–4.9)
Alkaline Phosphatase: 73 IU/L (ref 44–121)
BUN/Creatinine Ratio: 13 (ref 9–23)
BUN: 10 mg/dL (ref 6–24)
Bilirubin Total: 1 mg/dL (ref 0.0–1.2)
CO2: 25 mmol/L (ref 20–29)
Calcium: 9.9 mg/dL (ref 8.7–10.2)
Chloride: 101 mmol/L (ref 96–106)
Creatinine, Ser: 0.8 mg/dL (ref 0.57–1.00)
Globulin, Total: 3.2 g/dL (ref 1.5–4.5)
Glucose: 99 mg/dL (ref 70–99)
Potassium: 5.1 mmol/L (ref 3.5–5.2)
Sodium: 141 mmol/L (ref 134–144)
Total Protein: 7.8 g/dL (ref 6.0–8.5)
eGFR: 94 mL/min/{1.73_m2} (ref >59)

## 2022-04-06 LAB — TSH+FREE T4
Free T4: 1.8 ng/dL — ABNORMAL HIGH (ref 0.82–1.77)
TSH: 6.94 u[IU]/mL — ABNORMAL HIGH (ref 0.450–4.500)

## 2022-04-06 LAB — LIPID PANEL
Chol/HDL Ratio: 3.7 ratio (ref 0.0–4.4)
Cholesterol, Total: 211 mg/dL — ABNORMAL HIGH (ref 100–199)
HDL: 57 mg/dL (ref >39)
LDL Chol Calc (NIH): 139 mg/dL — ABNORMAL HIGH (ref 0–99)
Triglycerides: 84 mg/dL (ref 0–149)
VLDL Cholesterol Cal: 15 mg/dL (ref 5–40)

## 2022-04-06 LAB — HEMOGLOBIN A1C
Est. average glucose Bld gHb Est-mCnc: 120 mg/dL
Hgb A1c MFr Bld: 5.8 % — ABNORMAL HIGH (ref 4.8–5.6)

## 2022-04-06 LAB — VITAMIN D 25 HYDROXY (VIT D DEFICIENCY, FRACTURES): Vit D, 25-Hydroxy: 22.4 ng/mL — ABNORMAL LOW (ref 30.0–100.0)

## 2022-04-06 MED ORDER — VITAMIN D (ERGOCALCIFEROL) 1.25 MG (50000 UNIT) PO CAPS: ORAL_CAPSULE | ORAL | 1 refills | Status: DC

## 2022-04-11 ENCOUNTER — Telehealth: Payer: Self-pay

## 2022-04-11 NOTE — Telephone Encounter (Signed)
Provider wanted to give pt a call, asked patient if she will now get MRI ankle? we need to work up soft tissue mass. since its end of the year. Patient declined, stating not at this time.

## 2022-09-08 ENCOUNTER — Other Ambulatory Visit: Payer: Self-pay | Admitting: Internal Medicine

## 2022-10-05 ENCOUNTER — Ambulatory Visit: Payer: BC Managed Care – PPO | Admitting: Internal Medicine

## 2022-10-17 IMAGING — US US EXTREM LOW*R* LIMITED
1 series · 14 of 25 positions shown · non-contrast
Comparison: None.

CLINICAL DATA: Right distal lower leg mass.

EXAM:
ULTRASOUND RIGHT LOWER EXTREMITY LIMITED
TECHNIQUE: Ultrasound examination of the lower extremity soft tissues was
performed in the area of clinical concern.

[Series 1: us extrem low*right* limited · 0.06mm/px · 35 acquisitions, 14 frames shown]
[im 1/35]
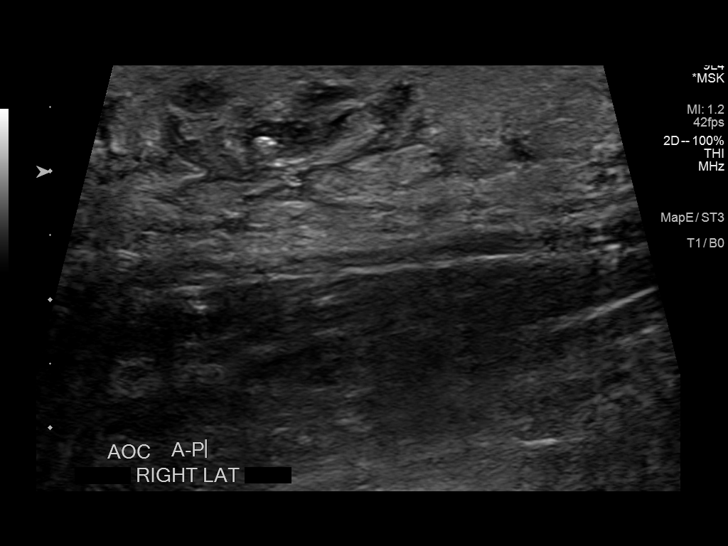
[im 3/35]
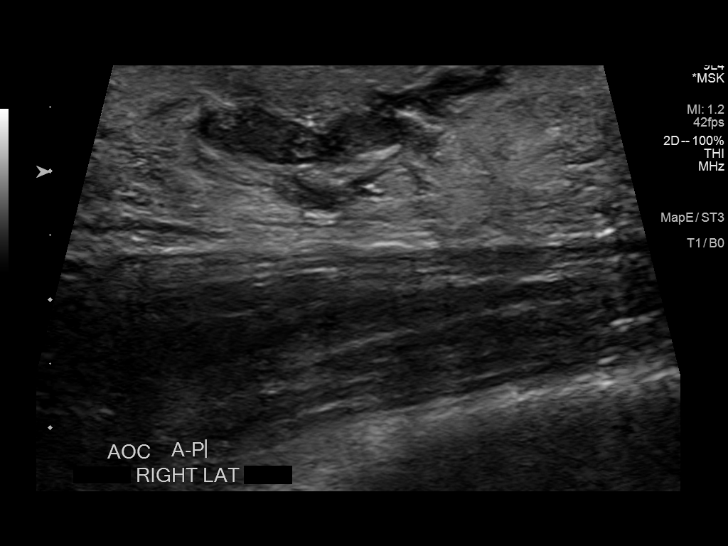
[im 6/35]
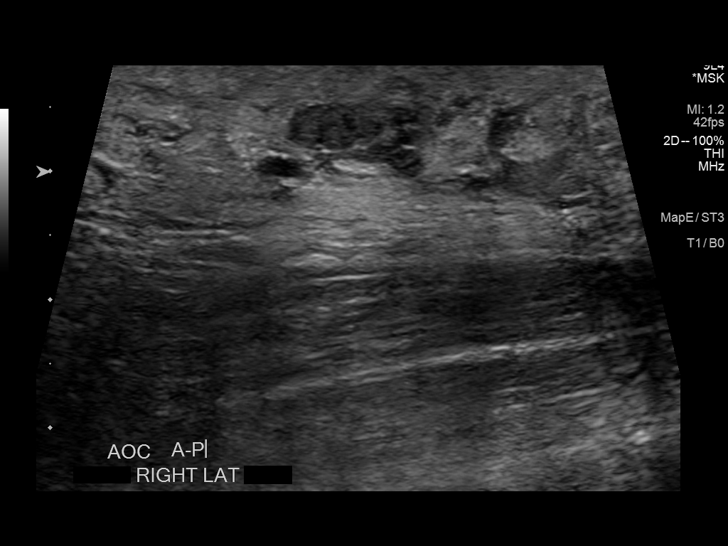
[im 9/35]
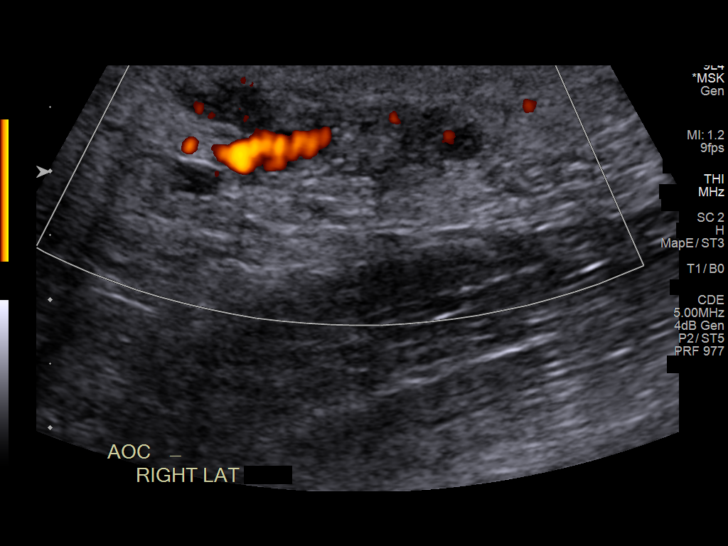
[im 12/35]
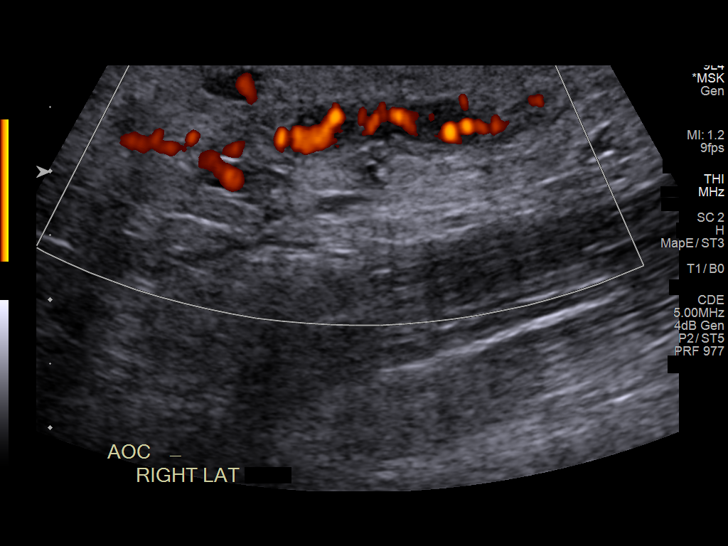
[im 13/35]
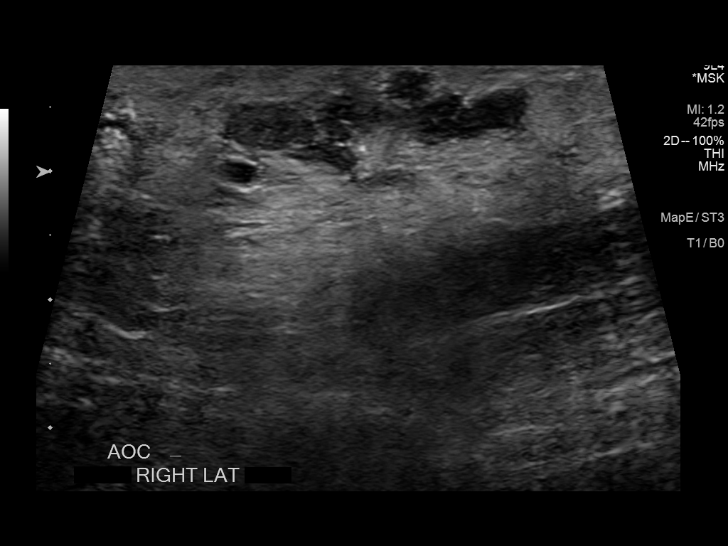
[im 16/35]
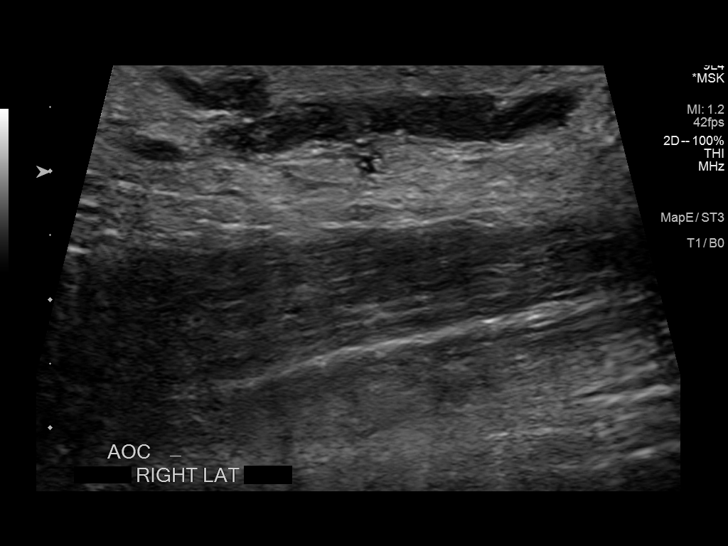
[im 19/35]
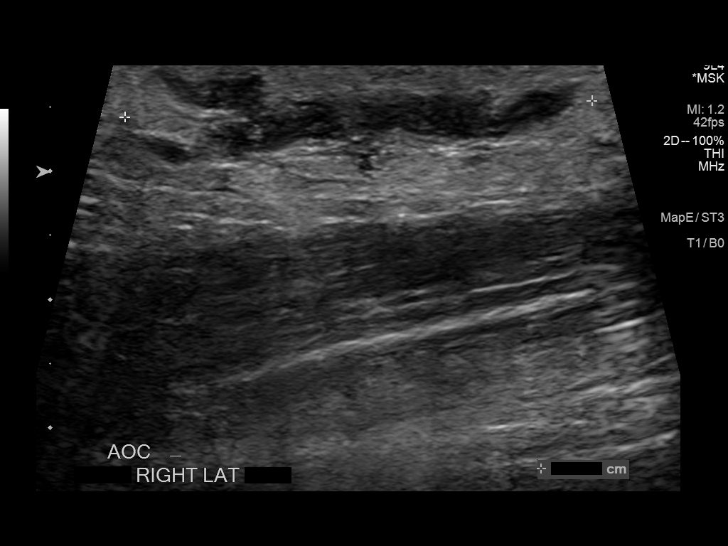
[im 22/35]
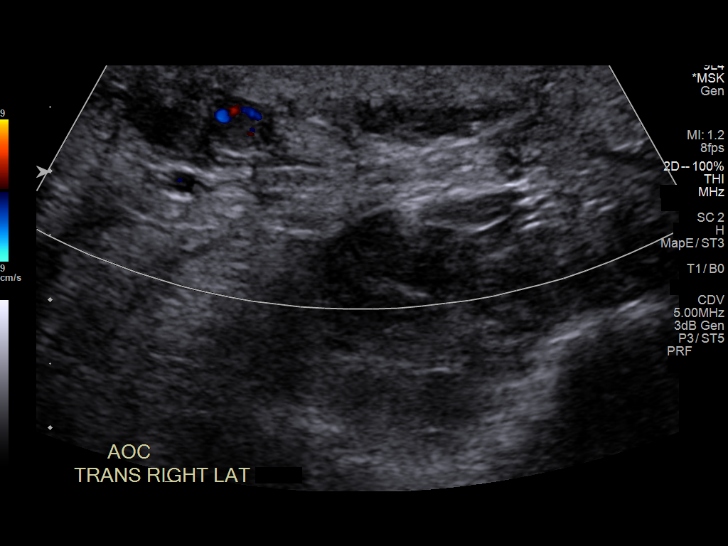
[im 23/35]
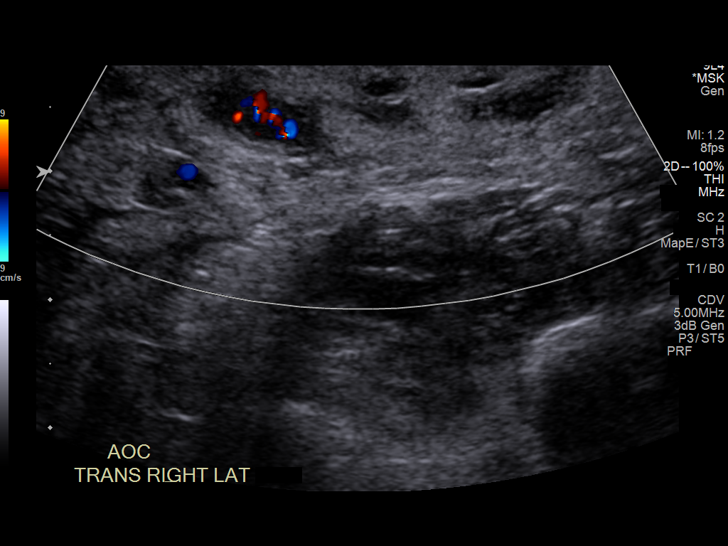
[im 26/35]
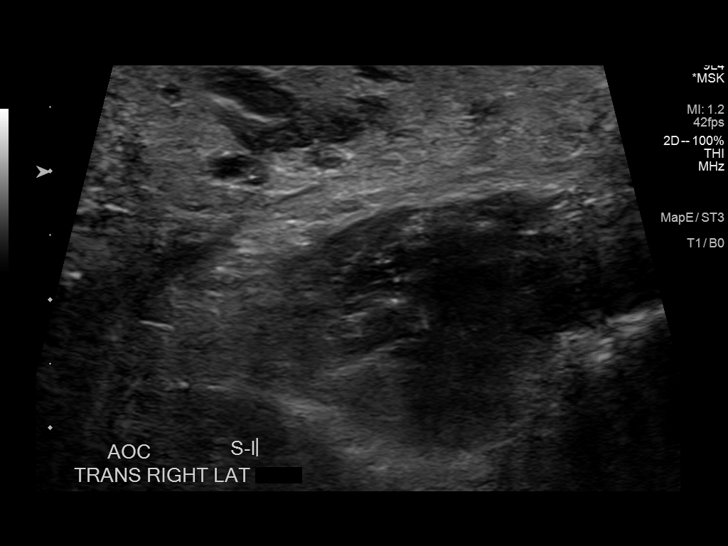
[im 29/35]
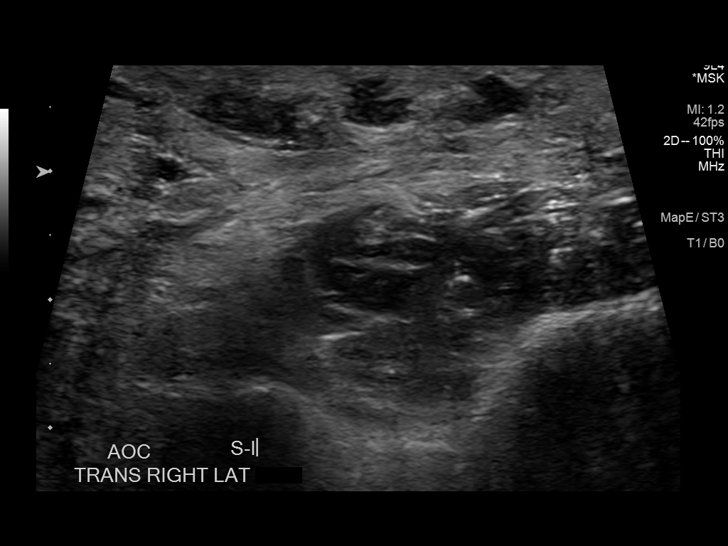
[im 32/35]
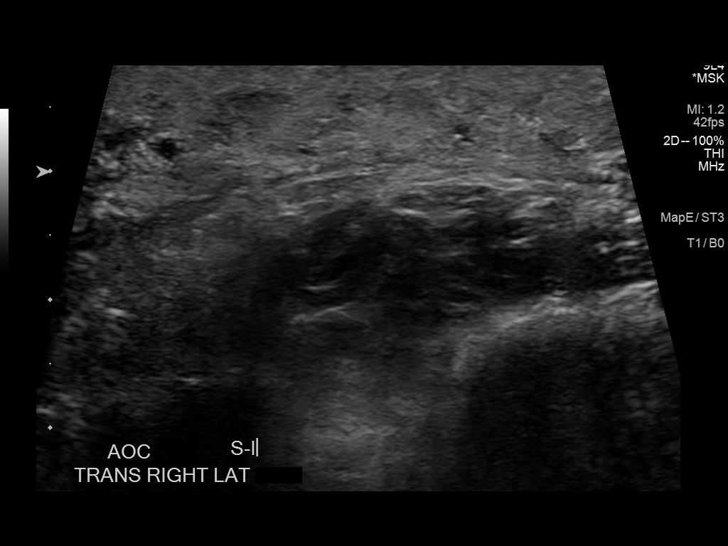
[im 35/35]
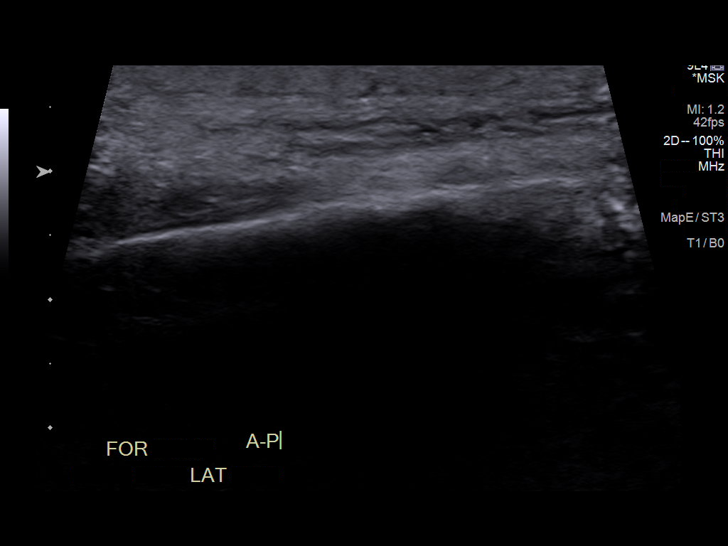

[14 of 25 positions shown; findings below may reference images not displayed]

FINDINGS: Focused ultrasound of the distal right lower leg in the area of
clinical concern demonstrates and ill-defined hypoechoic mass with
indistinct borders and prominent internal vascularity measuring
approximately 3.6 x 1.1 x 3.9 cm. There is surrounding soft tissue
swelling.
IMPRESSION: 1. Ill-defined 3.9 cm hypoechoic solid mass in the area of clinical
concern. Recommend MRI of the right lower leg with and without
contrast for further evaluation.

## 2022-11-16 ENCOUNTER — Ambulatory Visit: Payer: 59 | Admitting: Internal Medicine

## 2022-11-16 ENCOUNTER — Encounter: Payer: Self-pay | Admitting: Internal Medicine

## 2022-11-16 VITALS — BP 130/84 | HR 112 | Temp 98.4°F | Wt >= 6400 oz

## 2022-11-16 DIAGNOSIS — E039 Hypothyroidism, unspecified: Secondary | ICD-10-CM | POA: Diagnosis not present

## 2022-11-16 DIAGNOSIS — F4321 Adjustment disorder with depressed mood: Secondary | ICD-10-CM | POA: Diagnosis not present

## 2022-11-16 DIAGNOSIS — R7309 Other abnormal glucose: Secondary | ICD-10-CM

## 2022-11-16 DIAGNOSIS — R000 Tachycardia, unspecified: Secondary | ICD-10-CM

## 2022-11-16 DIAGNOSIS — I10 Essential (primary) hypertension: Secondary | ICD-10-CM | POA: Diagnosis not present

## 2022-11-16 DIAGNOSIS — Z6841 Body Mass Index (BMI) 40.0 and over, adult: Secondary | ICD-10-CM

## 2022-11-16 DIAGNOSIS — M1712 Unilateral primary osteoarthritis, left knee: Secondary | ICD-10-CM

## 2022-11-16 DIAGNOSIS — N926 Irregular menstruation, unspecified: Secondary | ICD-10-CM | POA: Insufficient documentation

## 2022-11-16 NOTE — Progress Notes (Signed)
I,Victoria T Deloria Lair, CMA,acting as a Neurosurgeon for Gwynneth Aliment, MD.,have documented all relevant documentation on the behalf of Gwynneth Aliment, MD,as directed by  Gwynneth Aliment, MD while in the presence of Gwynneth Aliment, MD.  Subjective:  Patient ID: Sarah Barton , female    DOB: 06/30/1980 , 42 y.o.   MRN: [id number]  Chief Complaint  Patient presents with   Hypertension   Hypothyroidism    HPI  Patient presents today for a BP check and thyroid follow up. She reports compliance with meds. She has not had any issues with the medication. Patient has no other concerns. Denies headache, chest pain, and SOB.  She also would like a handicap placard, she admits having trouble with her left knee. She reports not being pleased with current orthopedic care. She has OA; unfortunately, can't have surgery until she loses weight.    Letter sent to gyn for pap.     Hypertension This is a chronic problem. The current episode started more than 1 year ago. The problem has been gradually improving since onset. The problem is controlled. Pertinent negatives include no blurred vision, chest pain, orthopnea or shortness of breath. Risk factors for coronary artery disease include obesity and sedentary lifestyle. Past treatments include ACE inhibitors and diuretics. The current treatment provides moderate improvement. Compliance problems include exercise.      Past Medical History:  Diagnosis Date   Angio-edema    Depression    Dysmetabolic syndrome    Hypertension    Hypothyroidism    Morbid obesity (HCC)    Obesity, morbid (HCC)    Sciatica    Thyroid disease    Urticaria      Family History  Problem Relation Age of Onset   Hypertension Mother    Migraines Mother    Anemia Mother    Asthma Mother    Cancer Maternal Grandfather        Colon   Cancer Paternal Grandmother        Breast   Diabetes Paternal Uncle    Kidney disease Paternal Uncle    Diabetes Paternal Aunt    Migraines  Maternal Aunt    Sarcoidosis Father    Gout Father    Prostate cancer Father      Current Outpatient Medications:    albuterol (VENTOLIN HFA) 108 (90 Base) MCG/ACT inhaler, Inhale 2 puffs into the lungs every 6 (six) hours as needed for wheezing or shortness of breath., Disp: 18 g, Rfl: 1   EPINEPHrine 0.3 mg/0.3 mL IJ SOAJ injection, Inject 0.3 mLs (0.3 mg total) into the muscle as needed for anaphylaxis., Disp: 2 each, Rfl: 3   levothyroxine (SYNTHROID) 150 MCG tablet, Take 1 tablet (150 mcg total) by mouth daily before breakfast., Disp: 90 tablet, Rfl: 1   rosuvastatin (CRESTOR) 20 MG tablet, TAKE 1 TABLET BY MOUTH EVERYDAY AT BEDTIME, Disp: 90 tablet, Rfl: 2   sertraline (ZOLOFT) 100 MG tablet, Take 100 mg by mouth daily. 2 tabs daily, Disp: , Rfl:    valsartan (DIOVAN) 80 MG tablet, TAKE 1 TABLET BY MOUTH EVERY DAY, Disp: 90 tablet, Rfl: 1   Vitamin D, Ergocalciferol, (DRISDOL) 1.25 MG (50000 UNIT) CAPS capsule, One capsule po qweekly, Disp: 12 capsule, Rfl: 1   Allergies  Allergen Reactions   Ace Inhibitors Swelling     Review of Systems  Constitutional: Negative.   Eyes:  Negative for blurred vision.  Respiratory: Negative.  Negative for shortness of breath.  Cardiovascular: Negative.  Negative for chest pain and orthopnea.  Gastrointestinal: Negative.   Musculoskeletal:  Positive for arthralgias.  Neurological: Negative.   Psychiatric/Behavioral:  Positive for dysphoric mood.        Unfortunately, her father died unexpectedly since her last visit. She admits that has been stressful to her. She is willing to consider counseling.      Today's Vitals   11/16/22 1603  BP: 130/84  Pulse: (!) 112  Temp: 98.4 F (36.9 C)  SpO2: 98%  Weight: (!) 452 lb 3.2 oz (205.1 kg)   Body mass index is 82.71 kg/m.  Wt Readings from Last 3 Encounters:  11/16/22 (!) 452 lb 3.2 oz (205.1 kg)  04/05/22 (!) 457 lb 6.4 oz (207.5 kg)  11/30/21 (!) 468 lb 3.2 oz (212.4 kg)      Objective:  Physical Exam Vitals and nursing note reviewed.  Constitutional:      Appearance: Normal appearance. She is obese.  HENT:     Head: Normocephalic and atraumatic.  Eyes:     Extraocular Movements: Extraocular movements intact.  Cardiovascular:     Rate and Rhythm: Normal rate and regular rhythm.     Heart sounds: Normal heart sounds.  Pulmonary:     Effort: Pulmonary effort is normal.     Breath sounds: Normal breath sounds.  Musculoskeletal:        General: Swelling present.     Cervical back: Normal range of motion.  Skin:    General: Skin is warm.  Neurological:     General: No focal deficit present.     Mental Status: She is alert.  Psychiatric:        Mood and Affect: Mood normal.        Behavior: Behavior normal.         Assessment And Plan:  Essential hypertension Assessment & Plan: Chronic, fair control. She is  currently on valsartan 80mg  daily. If elevated at next visit, I will consider increasing dose to 160mg  daily. She will f/u in 3-6 months for re-evaluation.   Orders: -     CBC -     CMP14+EGFR  Primary hypothyroidism Assessment & Plan: Chronic, I will check a thyroid panel and adjust meds as needed. For now, she will continue with Synthroid daily.   Orders: -     TSH + free T4  Tachycardia Assessment & Plan: Today's heart rate is noted. She would likely benefit from Magnesium supplementation nightly. Also encouraged to stay well hydrated, aiming for at least 80 ounces of water daily.    Grief Assessment & Plan: She agrees to referral to Hospice for grief counseling.   Orders: -     Ambulatory referral to Hospice  Irregular menses Assessment & Plan: I will refer her to GYN for further evaluation. She has been evaluated by Henreitta Leber, PA in the past. Again, I will check her thyroid function.   Orders: -     Ambulatory referral to Gynecology  Other abnormal glucose Assessment & Plan: Previous labs reviewed, her  A1c has been elevated in the past. I will check an A1c today. Reminded to avoid refined sugars including sugary drinks/foods and processed meats including bacon, sausages and deli meats.    Orders: -     Hemoglobin A1c  Primary osteoarthritis of left knee Assessment & Plan: Chronic, she may benefit from [facility] (former name) evaluation.    Class 3 severe obesity due to excess calories with serious comorbidity and body  mass index (BMI) greater than or equal to 70 in adult [facility]) Assessment & Plan: BMI 82.  She does not wish to consider bariatric surgery at this time. We will consider prescription weight loss meds. I will send rx Zepbound/Wegovy to take once weekly. She denies having family history of thyroid cancer.    She is encouraged to strive for BMI less than 30 to decrease cardiac risk. Advised to aim for at least 150 minutes of exercise per week.    Return if symptoms worsen or fail to improve.  Patient was given opportunity to ask questions. Patient verbalized understanding of the plan and was able to repeat key elements of the plan. All questions were answered to their satisfaction.   I, Gwynneth Aliment, MD, have reviewed all documentation for this visit. The documentation on 11/16/22 for the exam, diagnosis, procedures, and orders are all accurate and complete.  IF YOU HAVE BEEN REFERRED TO A SPECIALIST, IT MAY TAKE 1-2 WEEKS TO SCHEDULE/PROCESS THE REFERRAL. IF YOU HAVE NOT HEARD FROM US/SPECIALIST IN TWO WEEKS, PLEASE GIVE [place] A CALL AT [phone].   THE PATIENT IS ENCOURAGED TO PRACTICE SOCIAL DISTANCING DUE TO THE COVID-19 PANDEMIC.

## 2022-11-16 NOTE — Patient Instructions (Signed)
Hypertension, Adult Hypertension is another name for high blood pressure. High blood pressure forces your heart to work harder to pump blood. This can cause problems over time. There are two numbers in a blood pressure reading. There is a top number (systolic) over a bottom number (diastolic). It is best to have a blood pressure that is below 120/80. What are the causes? The cause of this condition is not known. Some other conditions can lead to high blood pressure. What increases the risk? Some lifestyle factors can make you more likely to develop high blood pressure: Smoking. Not getting enough exercise or physical activity. Being overweight. Having too much fat, sugar, calories, or salt (sodium) in your diet. Drinking too much alcohol. Other risk factors include: Having any of these conditions: Heart disease. Diabetes. High cholesterol. Kidney disease. Obstructive sleep apnea. Having a family history of high blood pressure and high cholesterol. Age. The risk increases with age. Stress. What are the signs or symptoms? High blood pressure may not cause symptoms. Very high blood pressure (hypertensive crisis) may cause: Headache. Fast or uneven heartbeats (palpitations). Shortness of breath. Nosebleed. Vomiting or feeling like you may vomit (nauseous). Changes in how you see. Very bad chest pain. Feeling dizzy. Seizures. How is this treated? This condition is treated by making healthy lifestyle changes, such as: Eating healthy foods. Exercising more. Drinking less alcohol. Your doctor may prescribe medicine if lifestyle changes do not help enough and if: Your top number is above 130. Your bottom number is above 80. Your personal target blood pressure may vary. Follow these instructions at home: Eating and drinking  If told, follow the DASH eating plan. To follow this plan: Fill one half of your plate at each meal with fruits and vegetables. Fill one fourth of your plate  at each meal with whole grains. Whole grains include whole-wheat pasta, brown rice, and whole-grain bread. Eat or drink low-fat dairy products, such as skim milk or low-fat yogurt. Fill one fourth of your plate at each meal with low-fat (lean) proteins. Low-fat proteins include fish, chicken without skin, eggs, beans, and tofu. Avoid fatty meat, cured and processed meat, or chicken with skin. Avoid pre-made or processed food. Limit the amount of salt in your diet to less than 1,500 mg each day. Do not drink alcohol if: Your doctor tells you not to drink. You are pregnant, may be pregnant, or are planning to become pregnant. If you drink alcohol: Limit how much you have to: 0-1 drink a day for women. 0-2 drinks a day for men. Know how much alcohol is in your drink. In the [place] one drink equals one 12 oz bottle of beer (355 mL), one 5 oz glass of wine (148 mL), or one 1 oz glass of hard liquor (44 mL). Lifestyle  Work with your doctor to stay at a healthy weight or to lose weight. Ask your doctor what the best weight is for you. Get at least 30 minutes of exercise that causes your heart to beat faster (aerobic exercise) most days of the week. This may include walking, swimming, or biking. Get at least 30 minutes of exercise that strengthens your muscles (resistance exercise) at least 3 days a week. This may include lifting weights or doing Pilates. Do not smoke or use any products that contain nicotine or tobacco. If you need help quitting, ask your doctor. Check your blood pressure at home as told by your doctor. Keep all follow-up visits. Medicines Take over-the-counter and prescription medicines   only as told by your doctor. Follow directions carefully. Do not skip doses of blood pressure medicine. The medicine does not work as well if you skip doses. Skipping doses also puts you at risk for problems. Ask your doctor about side effects or reactions to medicines that you should watch  for. Contact a doctor if: You think you are having a reaction to the medicine you are taking. You have headaches that keep coming back. You feel dizzy. You have swelling in your ankles. You have trouble with your vision. Get help right away if: You get a very bad headache. You start to feel mixed up (confused). You feel weak or numb. You feel faint. You have very bad pain in your: Chest. Belly (abdomen). You vomit more than once. You have trouble breathing. These symptoms may be an emergency. Get help right away. Call 911. Do not wait to see if the symptoms will go away. Do not drive yourself to the hospital. Summary Hypertension is another name for high blood pressure. High blood pressure forces your heart to work harder to pump blood. For most people, a normal blood pressure is less than 120/80. Making healthy choices can help lower blood pressure. If your blood pressure does not get lower with healthy choices, you may need to take medicine. This information is not intended to replace advice given to you by your health care provider. Make sure you discuss any questions you have with your health care provider. Document Revised: 02/18/2021 Document Reviewed: 02/18/2021 Elsevier Patient Education  2024 [employer]  

## 2022-11-17 LAB — CMP14+EGFR
ALT: 14 IU/L (ref 0–32)
AST: 21 IU/L (ref 0–40)
Albumin: 4.2 g/dL (ref 3.9–4.9)
Alkaline Phosphatase: 71 IU/L (ref 44–121)
BUN/Creatinine Ratio: 15 (ref 9–23)
BUN: 11 mg/dL (ref 6–24)
Bilirubin Total: 0.8 mg/dL (ref 0.0–1.2)
CO2: 24 mmol/L (ref 20–29)
Calcium: 9.6 mg/dL (ref 8.7–10.2)
Chloride: 100 mmol/L (ref 96–106)
Creatinine, Ser: 0.74 mg/dL (ref 0.57–1.00)
Globulin, Total: 3.2 g/dL (ref 1.5–4.5)
Glucose: 97 mg/dL (ref 70–99)
Potassium: 4.7 mmol/L (ref 3.5–5.2)
Sodium: 140 mmol/L (ref 134–144)
Total Protein: 7.4 g/dL (ref 6.0–8.5)
eGFR: 102 mL/min/{1.73_m2} (ref >59)

## 2022-11-17 LAB — CBC
Hematocrit: 42.9 % (ref 34.0–46.6)
Hemoglobin: 13.6 g/dL (ref 11.1–15.9)
MCH: 26.7 pg (ref 26.6–33.0)
MCHC: 31.7 g/dL (ref 31.5–35.7)
MCV: 84 fL (ref 79–97)
Platelets: 253 10*3/uL (ref 150–450)
RBC: 5.09 x10E6/uL (ref 3.77–5.28)
RDW: 14.2 % (ref 11.7–15.4)
WBC: 7.1 10*3/uL (ref 3.4–10.8)

## 2022-11-17 LAB — HEMOGLOBIN A1C
Est. average glucose Bld gHb Est-mCnc: 117 mg/dL
Hgb A1c MFr Bld: 5.7 % — ABNORMAL HIGH (ref 4.8–5.6)

## 2022-11-17 LAB — TSH+FREE T4
Free T4: 1.54 ng/dL (ref 0.82–1.77)
TSH: 2.64 u[IU]/mL (ref 0.450–4.500)

## 2022-11-21 DIAGNOSIS — M1712 Unilateral primary osteoarthritis, left knee: Secondary | ICD-10-CM | POA: Insufficient documentation

## 2022-11-21 DIAGNOSIS — R000 Tachycardia, unspecified: Secondary | ICD-10-CM | POA: Insufficient documentation

## 2022-11-21 NOTE — Assessment & Plan Note (Signed)
Today's heart rate is noted. She would likely benefit from Magnesium supplementation nightly. Also encouraged to stay well hydrated, aiming for at least 80 ounces of water daily.

## 2022-11-21 NOTE — Assessment & Plan Note (Signed)
Chronic, I will check a thyroid panel and adjust meds as needed. For now, she will continue with Synthroid daily.

## 2022-11-21 NOTE — Assessment & Plan Note (Addendum)
Chronic, fair control. She is  currently on valsartan 80mg  daily. If elevated at next visit, I will consider increasing dose to 160mg  daily. She will f/u in 3-6 months for re-evaluation.

## 2022-11-21 NOTE — Assessment & Plan Note (Signed)
I will refer her to GYN for further evaluation. She has been evaluated by Henreitta Leber, PA in the past. Again, I will check her thyroid function.

## 2022-11-21 NOTE — Assessment & Plan Note (Signed)
Chronic, she may benefit from [facility] (former name) evaluation.

## 2022-11-21 NOTE — Assessment & Plan Note (Signed)
Previous labs reviewed, her A1c has been elevated in the past. I will check an A1c today. Reminded to avoid refined sugars including sugary drinks/foods and processed meats including bacon, sausages and deli meats.  

## 2022-11-21 NOTE — Assessment & Plan Note (Signed)
BMI 82.  She does not wish to consider bariatric surgery at this time. We will consider prescription weight loss meds. I will send rx Zepbound/Wegovy to take once weekly. She denies having family history of thyroid cancer.

## 2022-11-21 NOTE — Assessment & Plan Note (Signed)
She agrees to referral to Hospice for grief counseling.

## 2023-01-07 ENCOUNTER — Other Ambulatory Visit: Payer: Self-pay | Admitting: Internal Medicine

## 2023-02-11 ENCOUNTER — Other Ambulatory Visit: Payer: Self-pay | Admitting: Internal Medicine

## 2023-04-10 ENCOUNTER — Encounter: Payer: BC Managed Care – PPO | Admitting: Internal Medicine

## 2023-04-10 NOTE — Progress Notes (Deleted)
I,Garielle Mroz T Deloria Lair, CMA,acting as a Neurosurgeon for Gwynneth Aliment, MD.,have documented all relevant documentation on the behalf of Gwynneth Aliment, MD,as directed by  Gwynneth Aliment, MD while in the presence of Gwynneth Aliment, MD.  Subjective:    Patient ID: Sarah Barton , female    DOB: 08-Nov-1980 , 42 y.o.   MRN: [id number]  No chief complaint on file.   HPI  Patient presents today for a annual exam.  She is followed by GYN for her pelvic exams.  She reports compliance with medication and has no other concerns at this time. She denies having any headaches, chest pain and shortness of breath.       Hypertension This is a chronic problem. The current episode started more than 1 year ago. The problem has been gradually improving since onset. The problem is controlled. Pertinent negatives include no blurred vision, chest pain, orthopnea or shortness of breath. Risk factors for coronary artery disease include obesity and sedentary lifestyle. Past treatments include ACE inhibitors and diuretics. The current treatment provides moderate improvement. Compliance problems include exercise.      Past Medical History:  Diagnosis Date   Angio-edema    Depression    Dysmetabolic syndrome    Hypertension    Hypothyroidism    Morbid obesity (HCC)    Obesity, morbid (HCC)    Sciatica    Thyroid disease    Urticaria      Family History  Problem Relation Age of Onset   Hypertension Mother    Migraines Mother    Anemia Mother    Asthma Mother    Cancer Maternal Grandfather        Colon   Cancer Paternal Grandmother        Breast   Diabetes Paternal Uncle    Kidney disease Paternal Uncle    Diabetes Paternal Aunt    Migraines Maternal Aunt    Sarcoidosis Father    Gout Father    Prostate cancer Father      Current Outpatient Medications:    albuterol (VENTOLIN HFA) 108 (90 Base) MCG/ACT inhaler, Inhale 2 puffs into the lungs every 6 (six) hours as needed for wheezing or shortness  of breath., Disp: 18 g, Rfl: 1   EPINEPHrine 0.3 mg/0.3 mL IJ SOAJ injection, Inject 0.3 mLs (0.3 mg total) into the muscle as needed for anaphylaxis., Disp: 2 each, Rfl: 3   levothyroxine (SYNTHROID) 150 MCG tablet, TAKE 1 TABLET (150 MCG TOTAL) BY MOUTH DAILY BEFORE BREAKFAST. TAKE ONE TABLET BY MOUTH DAILY, Disp: 90 tablet, Rfl: 1   rosuvastatin (CRESTOR) 20 MG tablet, TAKE 1 TABLET BY MOUTH EVERYDAY AT BEDTIME, Disp: 90 tablet, Rfl: 2   sertraline (ZOLOFT) 100 MG tablet, Take 100 mg by mouth daily. 2 tabs daily, Disp: , Rfl:    valsartan (DIOVAN) 80 MG tablet, TAKE 1 TABLET BY MOUTH EVERY DAY, Disp: 90 tablet, Rfl: 1   Vitamin D, Ergocalciferol, (DRISDOL) 1.25 MG (50000 UNIT) CAPS capsule, ONE CAPSULE BY MOUTH WEEKLY, Disp: 12 capsule, Rfl: 1   Allergies  Allergen Reactions   Ace Inhibitors Swelling      The patient states she uses {contraceptive methods:5051} for birth control. No LMP recorded. (Menstrual status: Irregular Periods).. {Dysmenorrhea-menorrhagia:21918}. Negative for: breast discharge, breast lump(s), breast pain and breast self exam. Associated symptoms include abnormal vaginal bleeding. Pertinent negatives include abnormal bleeding (hematology), anxiety, decreased libido, depression, difficulty falling sleep, dyspareunia, history of infertility, nocturia, sexual dysfunction, sleep disturbances, urinary incontinence,  urinary urgency, vaginal discharge and vaginal itching. Diet regular.The patient states her exercise level is    . The patient's tobacco use is:  Social History   Tobacco Use  Smoking Status Never  Smokeless Tobacco Never  . She has been exposed to passive smoke. The patient's alcohol use is:  Social History   Substance and Sexual Activity  Alcohol Use Yes   Comment: rarely   . Additional information: Last pap ***, next one scheduled for ***.    Review of Systems  Constitutional: Negative.   HENT: Negative.    Eyes: Negative.  Negative for blurred  vision.  Respiratory: Negative.  Negative for shortness of breath.   Cardiovascular: Negative.  Negative for chest pain and orthopnea.  Gastrointestinal: Negative.   Endocrine: Negative.   Genitourinary: Negative.   Musculoskeletal: Negative.   Skin: Negative.   Allergic/Immunologic: Negative.   Neurological: Negative.   Hematological: Negative.   Psychiatric/Behavioral: Negative.       There were no vitals filed for this visit. There is no height or weight on file to calculate BMI.  Wt Readings from Last 3 Encounters:  11/16/22 (!) 452 lb 3.2 oz (205.1 kg)  04/05/22 (!) 457 lb 6.4 oz (207.5 kg)  11/30/21 (!) 468 lb 3.2 oz (212.4 kg)     Objective:  Physical Exam      Assessment And Plan:     Annual physical exam  Essential hypertension  Primary hypothyroidism  Other abnormal glucose     No follow-ups on file. Patient was given opportunity to ask questions. Patient verbalized understanding of the plan and was able to repeat key elements of the plan. All questions were answered to their satisfaction.   Gwynneth Aliment, MD  I, Gwynneth Aliment, MD, have reviewed all documentation for this visit. The documentation on 04/10/23 for the exam, diagnosis, procedures, and orders are all accurate and complete.

## 2023-04-14 ENCOUNTER — Other Ambulatory Visit: Payer: Self-pay | Admitting: Internal Medicine

## 2023-06-20 ENCOUNTER — Other Ambulatory Visit: Payer: Self-pay | Admitting: Internal Medicine

## 2023-06-28 ENCOUNTER — Ambulatory Visit: Payer: 59 | Admitting: Internal Medicine

## 2023-07-12 ENCOUNTER — Other Ambulatory Visit: Payer: Self-pay | Admitting: Internal Medicine

## 2023-07-20 ENCOUNTER — Ambulatory Visit: Payer: 59 | Admitting: Internal Medicine

## 2023-07-20 VITALS — BP 126/82 | HR 95 | Temp 97.7°F | Ht 62.0 in | Wt >= 6400 oz

## 2023-07-20 DIAGNOSIS — Z6841 Body Mass Index (BMI) 40.0 and over, adult: Secondary | ICD-10-CM

## 2023-07-20 DIAGNOSIS — R7309 Other abnormal glucose: Secondary | ICD-10-CM

## 2023-07-20 DIAGNOSIS — E66813 Obesity, class 3: Secondary | ICD-10-CM

## 2023-07-20 DIAGNOSIS — Z23 Encounter for immunization: Secondary | ICD-10-CM

## 2023-07-20 DIAGNOSIS — F3341 Major depressive disorder, recurrent, in partial remission: Secondary | ICD-10-CM | POA: Diagnosis not present

## 2023-07-20 DIAGNOSIS — E039 Hypothyroidism, unspecified: Secondary | ICD-10-CM | POA: Diagnosis not present

## 2023-07-20 DIAGNOSIS — R000 Tachycardia, unspecified: Secondary | ICD-10-CM

## 2023-07-20 DIAGNOSIS — I10 Essential (primary) hypertension: Secondary | ICD-10-CM

## 2023-07-20 NOTE — Progress Notes (Signed)
 I,Victoria T Deloria Lair, CMA,acting as a Neurosurgeon for Gwynneth Aliment, MD.,have documented all relevant documentation on the behalf of Gwynneth Aliment, MD,as directed by  Gwynneth Aliment, MD while in the presence of Gwynneth Aliment, MD.  Subjective:  Patient ID: Sarah Barton , female    DOB: [birth year] , 43 y.o.   MRN: [id number]  Chief Complaint  Patient presents with   Hypertension   Hypothyroidism    HPI  Patient presents today for a BP check and thyroid follow up. She reports compliance with meds. She has not had any issues with the medication. Patient has no other concerns. Denies headache, chest pain, and SOB.      Hypertension This is a chronic problem. The current episode started more than 1 year ago. The problem has been gradually improving since onset. The problem is controlled. Pertinent negatives include no blurred vision, chest pain, orthopnea or shortness of breath. Risk factors for coronary artery disease include obesity and sedentary lifestyle. Past treatments include ACE inhibitors and diuretics. The current treatment provides moderate improvement. Compliance problems include exercise.      Past Medical History:  Diagnosis Date   Angio-edema    Depression    Dysmetabolic syndrome    Hypertension    Hypothyroidism    Morbid obesity (HCC)    Obesity, morbid (HCC)    Sciatica    Thyroid disease    Urticaria      Family History  Problem Relation Age of Onset   Hypertension Mother    Migraines Mother    Anemia Mother    Asthma Mother    Cancer Maternal Grandfather        Colon   Cancer Paternal Grandmother        Breast   Diabetes Paternal Uncle    Kidney disease Paternal Uncle    Diabetes Paternal Aunt    Migraines Maternal Aunt    Sarcoidosis Father    Gout Father    Prostate cancer Father      Current Outpatient Medications:    albuterol (VENTOLIN HFA) 108 (90 Base) MCG/ACT inhaler, Inhale 2 puffs into the lungs every 6 (six) hours as needed for  wheezing or shortness of breath., Disp: 18 g, Rfl: 1   EPINEPHrine 0.3 mg/0.3 mL IJ SOAJ injection, Inject 0.3 mLs (0.3 mg total) into the muscle as needed for anaphylaxis., Disp: 2 each, Rfl: 3   levothyroxine (SYNTHROID) 150 MCG tablet, TAKE 1 TABLET (150 MCG TOTAL) BY MOUTH DAILY BEFORE BREAKFAST. TAKE ONE TABLET BY MOUTH DAILY, Disp: 90 tablet, Rfl: 1   rosuvastatin (CRESTOR) 20 MG tablet, TAKE 1 TABLET BY MOUTH EVERYDAY AT BEDTIME, Disp: 90 tablet, Rfl: 2   sertraline (ZOLOFT) 100 MG tablet, Take 100 mg by mouth daily. 2 tabs daily, Disp: , Rfl:    valsartan (DIOVAN) 80 MG tablet, TAKE 1 TABLET BY MOUTH EVERY DAY, Disp: 90 tablet, Rfl: 1   Vitamin D, Ergocalciferol, (DRISDOL) 1.25 MG (50000 UNIT) CAPS capsule, ONE CAPSULE BY MOUTH WEEKLY, Disp: 12 capsule, Rfl: 1   Allergies  Allergen Reactions   Ace Inhibitors Swelling     Review of Systems  Constitutional: Negative.   Eyes:  Negative for blurred vision.  Respiratory: Negative.  Negative for shortness of breath.   Cardiovascular: Negative.  Negative for chest pain and orthopnea.  Gastrointestinal: Negative.   Neurological: Negative.   Psychiatric/Behavioral: Negative.       Today's Vitals   07/20/23 1607  BP: 126/82  Pulse:  95  Temp: 97.7 F (36.5 C)  SpO2: 98%  Weight: (!) 447 lb 6.4 oz (202.9 kg)  Height: 5\' 2"  (1.575 m)   Body mass index is 81.83 kg/m.  Wt Readings from Last 3 Encounters:  07/20/23 (!) 447 lb 6.4 oz (202.9 kg)  11/16/22 (!) 452 lb 3.2 oz (205.1 kg)  04/05/22 (!) 457 lb 6.4 oz (207.5 kg)     Objective:  Physical Exam Vitals and nursing note reviewed.  Constitutional:      Appearance: Normal appearance. She is obese.  HENT:     Head: Normocephalic and atraumatic.  Eyes:     Extraocular Movements: Extraocular movements intact.  Cardiovascular:     Rate and Rhythm: Regular rhythm. Tachycardia present.     Heart sounds: Normal heart sounds.  Pulmonary:     Effort: Pulmonary effort is normal.      Breath sounds: Normal breath sounds.  Musculoskeletal:     Cervical back: Normal range of motion.  Skin:    General: Skin is warm.  Neurological:     General: No focal deficit present.     Mental Status: She is alert.  Psychiatric:        Mood and Affect: Mood normal.        Behavior: Behavior normal.         Assessment And Plan:  Essential hypertension Assessment & Plan: Chronic, fair control. She is  currently on valsartan 80mg  daily. She is encouraged to follow low sodium diet. No med changes today. She is encouraged to incorporate more exercise into her daily routine. She will f/u in 3-6 months for re-evaluation.   Orders: -     CMP14+EGFR  Primary hypothyroidism Assessment & Plan: Chronic, I will check a thyroid panel and adjust meds as needed. For now, she will continue with Synthroid daily.  She will f/ uin four to six months for re-evaluation.   Orders: -     TSH + free T4  Recurrent major depressive disorder, in partial remission (HCC) Assessment & Plan: Chronic, she is currently on sertraline. She is ready to start therapy. Referral placed as requested.   Orders: -     Ambulatory referral to Psychology  Tachycardia Assessment & Plan: Today's heart rate is noted. She would likely benefit from Magnesium supplementation nightly. Also encouraged to stay well hydrated, aiming for at least 80 ounces of water daily.    Other abnormal glucose Assessment & Plan: Previous labs reviewed, her A1c has been elevated in the past. I will check an A1c today. Reminded to avoid refined sugars including sugary drinks/foods and processed meats including bacon, sausages and deli meats.    Orders: -     Hemoglobin A1c  Class 3 severe obesity due to excess calories with serious comorbidity and body mass index (BMI) greater than or equal to 70 in adult [facility]) Assessment & Plan: BMI 81.  She does not wish to consider bariatric surgery at this time. We will consider  prescription weight loss meds. I will send rx Zepbound/Wegovy to take once weekly. She denies having family history of thyroid cancer.    Immunization due -     Flu vaccine trivalent PF, 6mos and older(Flulaval,Afluria,Fluarix,Fluzone)  She is encouraged to strive for BMI less than 30 to decrease cardiac risk. Advised to aim for at least 150 minutes of exercise per week.    Return in 4 months (on 11/19/2023), or physical examination.  Patient was given opportunity to ask questions. Patient  verbalized understanding of the plan and was able to repeat key elements of the plan. All questions were answered to their satisfaction.    I, Gwynneth Aliment, MD, have reviewed all documentation for this visit. The documentation on 07/20/23 for the exam, diagnosis, procedures, and orders are all accurate and complete.  IF YOU HAVE BEEN REFERRED TO A SPECIALIST, IT MAY TAKE 1-2 WEEKS TO SCHEDULE/PROCESS THE REFERRAL. IF YOU HAVE NOT HEARD FROM US/SPECIALIST IN TWO WEEKS, PLEASE GIVE [place] A CALL AT [phone].   THE PATIENT IS ENCOURAGED TO PRACTICE SOCIAL DISTANCING DUE TO THE COVID-19 PANDEMIC.

## 2023-07-20 NOTE — Patient Instructions (Addendum)
 [facility] Sarah Barton   Hypertension, Adult Hypertension is another name for high blood pressure. High blood pressure forces your heart to work harder to pump blood. This can cause problems over time. There are two numbers in a blood pressure reading. There is a top number (systolic) over a bottom number (diastolic). It is best to have a blood pressure that is below 120/80. What are the causes? The cause of this condition is not known. Some other conditions can lead to high blood pressure. What increases the risk? Some lifestyle factors can make you more likely to develop high blood pressure: Smoking. Not getting enough exercise or physical activity. Being overweight. Having too much fat, sugar, calories, or salt (sodium) in your diet. Drinking too much alcohol. Other risk factors include: Having any of these conditions: Heart disease. Diabetes. High cholesterol. Kidney disease. Obstructive sleep apnea. Having a family history of high blood pressure and high cholesterol. Age. The risk increases with age. Stress. What are the signs or symptoms? High blood pressure may not cause symptoms. Very high blood pressure (hypertensive crisis) may cause: Headache. Fast or uneven heartbeats (palpitations). Shortness of breath. Nosebleed. Vomiting or feeling like you may vomit (nauseous). Changes in how you see. Very bad chest pain. Feeling dizzy. Seizures. How is this treated? This condition is treated by making healthy lifestyle changes, such as: Eating healthy foods. Exercising more. Drinking less alcohol. Your doctor may prescribe medicine if lifestyle changes do not help enough and if: Your top number is above 130. Your bottom number is above 80. Your personal target blood pressure may vary. Follow these instructions at home: Eating and drinking  If told, follow the DASH eating plan. To follow this plan: Fill one half of your plate at each meal with fruits and  vegetables. Fill one fourth of your plate at each meal with whole grains. Whole grains include whole-wheat pasta, brown rice, and whole-grain bread. Eat or drink low-fat dairy products, such as skim milk or low-fat yogurt. Fill one fourth of your plate at each meal with low-fat (lean) proteins. Low-fat proteins include fish, chicken without skin, eggs, beans, and tofu. Avoid fatty meat, cured and processed meat, or chicken with skin. Avoid pre-made or processed food. Limit the amount of salt in your diet to less than 1,500 mg each day. Do not drink alcohol if: Your doctor tells you not to drink. You are pregnant, may be pregnant, or are planning to become pregnant. If you drink alcohol: Limit how much you have to: 0-1 drink a day for women. 0-2 drinks a day for men. Know how much alcohol is in your drink. In the [place] one drink equals one 12 oz bottle of beer (355 mL), one 5 oz glass of wine (148 mL), or one 1 oz glass of hard liquor (44 mL). Lifestyle  Work with your doctor to stay at a healthy weight or to lose weight. Ask your doctor what the best weight is for you. Get at least 30 minutes of exercise that causes your heart to beat faster (aerobic exercise) most days of the week. This may include walking, swimming, or biking. Get at least 30 minutes of exercise that strengthens your muscles (resistance exercise) at least 3 days a week. This may include lifting weights or doing Pilates. Do not smoke or use any products that contain nicotine or tobacco. If you need help quitting, ask your doctor. Check your blood pressure at home as told by your doctor. Keep all follow-up visits.  Medicines Take over-the-counter and prescription medicines only as told by your doctor. Follow directions carefully. Do not skip doses of blood pressure medicine. The medicine does not work as well if you skip doses. Skipping doses also puts you at risk for problems. Ask your doctor about side effects or  reactions to medicines that you should watch for. Contact a doctor if: You think you are having a reaction to the medicine you are taking. You have headaches that keep coming back. You feel dizzy. You have swelling in your ankles. You have trouble with your vision. Get help right away if: You get a very bad headache. You start to feel mixed up (confused). You feel weak or numb. You feel faint. You have very bad pain in your: Chest. Belly (abdomen). You vomit more than once. You have trouble breathing. These symptoms may be an emergency. Get help right away. Call 911. Do not wait to see if the symptoms will go away. Do not drive yourself to the hospital. Summary Hypertension is another name for high blood pressure. High blood pressure forces your heart to work harder to pump blood. For most people, a normal blood pressure is less than 120/80. Making healthy choices can help lower blood pressure. If your blood pressure does not get lower with healthy choices, you may need to take medicine. This information is not intended to replace advice given to you by your health care provider. Make sure you discuss any questions you have with your health care provider. Document Revised: 02/18/2021 Document Reviewed: 02/18/2021 Elsevier Patient Education  2024 [employer].

## 2023-07-21 LAB — CMP14+EGFR
ALT: 14 IU/L (ref 0–32)
AST: 19 IU/L (ref 0–40)
Albumin: 4.6 g/dL (ref 3.9–4.9)
Alkaline Phosphatase: 70 IU/L (ref 44–121)
BUN/Creatinine Ratio: 16 (ref 9–23)
BUN: 11 mg/dL (ref 6–24)
Bilirubin Total: 0.7 mg/dL (ref 0.0–1.2)
CO2: 20 mmol/L (ref 20–29)
Calcium: 9.9 mg/dL (ref 8.7–10.2)
Chloride: 102 mmol/L (ref 96–106)
Creatinine, Ser: 0.68 mg/dL (ref 0.57–1.00)
Globulin, Total: 2.8 g/dL (ref 1.5–4.5)
Glucose: 96 mg/dL (ref 70–99)
Potassium: 4.7 mmol/L (ref 3.5–5.2)
Sodium: 142 mmol/L (ref 134–144)
Total Protein: 7.4 g/dL (ref 6.0–8.5)
eGFR: 110 mL/min/{1.73_m2} (ref >59)

## 2023-07-21 LAB — TSH+FREE T4
Free T4: 1.7 ng/dL (ref 0.82–1.77)
TSH: 6.22 u[IU]/mL — ABNORMAL HIGH (ref 0.450–4.500)

## 2023-07-21 LAB — HEMOGLOBIN A1C
Est. average glucose Bld gHb Est-mCnc: 114 mg/dL
Hgb A1c MFr Bld: 5.6 % (ref 4.8–5.6)

## 2023-07-25 DIAGNOSIS — F3341 Major depressive disorder, recurrent, in partial remission: Secondary | ICD-10-CM | POA: Insufficient documentation

## 2023-07-25 MED ORDER — TIRZEPATIDE-WEIGHT MANAGEMENT 2.5 MG/0.5ML ~~LOC~~ SOLN: 2.5000 mg | SUBCUTANEOUS | 0 refills | Status: DC

## 2023-07-25 NOTE — Assessment & Plan Note (Signed)
 Chronic, fair control. She is  currently on valsartan 80mg  daily. She is encouraged to follow low sodium diet. No med changes today. She is encouraged to incorporate more exercise into her daily routine. She will f/u in 3-6 months for re-evaluation.

## 2023-07-25 NOTE — Assessment & Plan Note (Signed)
 BMI 81.  She does not wish to consider bariatric surgery at this time. We will consider prescription weight loss meds. I will send rx Zepbound/Wegovy to take once weekly. She denies having family history of thyroid cancer.

## 2023-07-25 NOTE — Assessment & Plan Note (Signed)
 Chronic, I will check a thyroid panel and adjust meds as needed. For now, she will continue with Synthroid daily.  She will f/ uin four to six months for re-evaluation.

## 2023-07-25 NOTE — Assessment & Plan Note (Signed)
 Chronic, she is currently on sertraline. She is ready to start therapy. Referral placed as requested.

## 2023-07-25 NOTE — Assessment & Plan Note (Signed)
Today's heart rate is noted. She would likely benefit from Magnesium supplementation nightly. Also encouraged to stay well hydrated, aiming for at least 80 ounces of water daily.

## 2023-07-25 NOTE — Assessment & Plan Note (Signed)
 Previous labs reviewed, her A1c has been elevated in the past. I will check an A1c today. Reminded to avoid refined sugars including sugary drinks/foods and processed meats including bacon, sausages and deli meats.

## 2023-07-26 ENCOUNTER — Telehealth: Payer: Self-pay

## 2023-11-04 ENCOUNTER — Other Ambulatory Visit: Payer: Self-pay | Admitting: Internal Medicine

## 2023-11-21 ENCOUNTER — Encounter: Payer: Self-pay | Admitting: Internal Medicine

## 2023-11-21 ENCOUNTER — Encounter: Admitting: Internal Medicine

## 2023-11-23 ENCOUNTER — Other Ambulatory Visit: Payer: Self-pay | Admitting: Obstetrics and Gynecology

## 2023-11-23 DIAGNOSIS — Z1231 Encounter for screening mammogram for malignant neoplasm of breast: Secondary | ICD-10-CM

## 2023-12-12 ENCOUNTER — Other Ambulatory Visit: Payer: Self-pay | Admitting: Medical Genetics

## 2023-12-19 ENCOUNTER — Ambulatory Visit
Admission: RE | Admit: 2023-12-19 | Discharge: 2023-12-19 | Disposition: A | Discharge status: Home / Self Care | Source: Ambulatory Visit | Attending: Obstetrics and Gynecology | Admitting: Obstetrics and Gynecology

## 2023-12-19 DIAGNOSIS — Z1231 Encounter for screening mammogram for malignant neoplasm of breast: Secondary | ICD-10-CM

## 2024-02-01 ENCOUNTER — Other Ambulatory Visit: Payer: Self-pay | Admitting: Internal Medicine

## 2024-03-06 ENCOUNTER — Other Ambulatory Visit: Payer: Self-pay | Admitting: Medical Genetics

## 2024-03-06 DIAGNOSIS — Z006 Encounter for examination for normal comparison and control in clinical research program: Secondary | ICD-10-CM

## 2024-03-24 ENCOUNTER — Other Ambulatory Visit: Payer: Self-pay | Admitting: Internal Medicine

## 2024-04-10 ENCOUNTER — Encounter: Payer: Self-pay | Admitting: Internal Medicine

## 2024-04-10 ENCOUNTER — Ambulatory Visit (INDEPENDENT_AMBULATORY_CARE_PROVIDER_SITE_OTHER): Payer: Self-pay | Admitting: Internal Medicine

## 2024-04-10 VITALS — BP 138/88 | HR 96 | Temp 98.6°F | Ht 62.0 in | Wt >= 6400 oz

## 2024-04-10 DIAGNOSIS — I10 Essential (primary) hypertension: Secondary | ICD-10-CM | POA: Diagnosis not present

## 2024-04-10 DIAGNOSIS — H6123 Impacted cerumen, bilateral: Secondary | ICD-10-CM

## 2024-04-10 DIAGNOSIS — E039 Hypothyroidism, unspecified: Secondary | ICD-10-CM | POA: Diagnosis not present

## 2024-04-10 DIAGNOSIS — F3341 Major depressive disorder, recurrent, in partial remission: Secondary | ICD-10-CM | POA: Diagnosis not present

## 2024-04-10 DIAGNOSIS — Z0000 Encounter for general adult medical examination without abnormal findings: Secondary | ICD-10-CM

## 2024-04-10 DIAGNOSIS — N926 Irregular menstruation, unspecified: Secondary | ICD-10-CM

## 2024-04-10 DIAGNOSIS — Z1211 Encounter for screening for malignant neoplasm of colon: Secondary | ICD-10-CM | POA: Diagnosis not present

## 2024-04-10 LAB — POCT URINALYSIS DIPSTICK
Bilirubin, UA: NEGATIVE
Blood, UA: NEGATIVE
Glucose, UA: NEGATIVE
Ketones, UA: NEGATIVE
Leukocytes, UA: NEGATIVE
Nitrite, UA: NEGATIVE
Protein, UA: NEGATIVE
Spec Grav, UA: >=1.03 — AB (ref 1.010–1.025)
Urobilinogen, UA: 0.2 U/dL
pH, UA: 6 (ref 5.0–8.0)

## 2024-04-10 MED ORDER — VALSARTAN 80 MG PO TABS: 80.0000 mg | ORAL_TABLET | Freq: Every day | ORAL | 1 refills | Status: AC

## 2024-04-10 NOTE — Progress Notes (Signed)
 I,Victoria T Emmitt, CMA,acting as a neurosurgeon for Catheryn LOISE Slocumb, MD.,have documented all relevant documentation on the behalf of Catheryn LOISE Slocumb, MD,as directed by  Catheryn LOISE Slocumb, MD while in the presence of Catheryn LOISE Slocumb, MD.  Subjective:    Patient ID: Sarah Barton , female    DOB: [birth year] , 43 y.o.   MRN: [id number]  Chief Complaint  Patient presents with   Annual Exam    Patient presents today for annual exam. She reports compliance with medications. Denies headache, chest pain & sob. GYN: [facility]. She has not completed colonoscopy.  Letter sent to GYN for pap result.    Hypertension   Hypothyroidism    HPI Discussed the use of AI scribe software for clinical note transcription with the patient, who gave verbal consent to proceed.  History of Present Illness Sarah Barton is a 43 year old female who presents for a physical exam and blood pressure check.  She has not had a menstrual cycle since January 20th, approximately 10 months ago. Her menstrual cycles have always been irregular. She is not experiencing any known symptoms of menopause such as hot flashes, although she adjusts her air conditioning frequently, which she speculates might be related.  She is currently taking levothyroxine  150 mcg daily for thyroid  management but did not take her medication on the day of the visit due to waking up late. She is also prescribed sertraline 100 mg and valsartan  80 mg for blood pressure, but she ran out of valsartan  at the end of last month and has been unable to refill it due to issues with the pharmacy. She also takes rosuvastatin  20 mg for cholesterol, although there is some confusion about her regularity in picking up this medication from the pharmacy.  Her family history includes her mother having had colon polyps and her father having had colon cancer and prostate cancer, with the colon cancer being diagnosed in his sixties. This family history is relevant as she is due  for a colonoscopy.  She describes her mood as not the best, attributing it to the holiday season. She has noticed some difficulty with hearing, describing it as sometimes muffled, and requests to have her ears flushed during the visit.  Regarding her social history, work is going well, and she is not as physically active as she would like to be, acknowledging the need to work on a plan for weight management after noticing her weight during the visit.   Hypertension This is a chronic problem. The current episode started more than 1 year ago. The problem has been gradually improving since onset. The problem is controlled. Pertinent negatives include no blurred vision, chest pain, orthopnea or shortness of breath. Risk factors for coronary artery disease include obesity and sedentary lifestyle. Past treatments include ACE inhibitors and diuretics. The current treatment provides moderate improvement. Compliance problems include exercise.      Past Medical History:  Diagnosis Date   Angio-edema    Depression    Dysmetabolic syndrome    Hypertension    Hypothyroidism    Morbid obesity (HCC)    Obesity, morbid (HCC)    Sciatica    Thyroid  disease    Urticaria      Family History  Problem Relation Age of Onset   Hypertension Mother    Migraines Mother    Anemia Mother    Asthma Mother    Sarcoidosis Father    Gout Father    Prostate cancer Father  Migraines Maternal Aunt    Diabetes Paternal Aunt    Diabetes Paternal Uncle    Kidney disease Paternal Uncle    Colon cancer Maternal Grandfather    Breast cancer Paternal Great-grandmother      Current Outpatient Medications:    albuterol  (VENTOLIN  HFA) 108 (90 Base) MCG/ACT inhaler, Inhale 2 puffs into the lungs every 6 (six) hours as needed for wheezing or shortness of breath., Disp: 18 g, Rfl: 1   EPINEPHrine  0.3 mg/0.3 mL IJ SOAJ injection, Inject 0.3 mLs (0.3 mg total) into the muscle as needed for anaphylaxis., Disp: 2 each,  Rfl: 3   levothyroxine  (SYNTHROID ) 150 MCG tablet, TAKE 1 TABLET (150 MCG TOTAL) BY MOUTH DAILY BEFORE BREAKFAST. TAKE ONE TABLET BY MOUTH DAILY, Disp: 90 tablet, Rfl: 1   rosuvastatin  (CRESTOR ) 20 MG tablet, TAKE 1 TABLET BY MOUTH EVERYDAY AT BEDTIME, Disp: 90 tablet, Rfl: 2   sertraline (ZOLOFT) 100 MG tablet, Take 100 mg by mouth daily. 2 tabs daily, Disp: , Rfl:    Vitamin D , Ergocalciferol , (DRISDOL ) 1.25 MG (50000 UNIT) CAPS capsule, ONE CAPSULE BY MOUTH WEEKLY, Disp: 12 capsule, Rfl: 1   valsartan  (DIOVAN ) 80 MG tablet, Take 1 tablet (80 mg total) by mouth daily., Disp: 90 tablet, Rfl: 1   Allergies  Allergen Reactions   Ace Inhibitors Swelling      The patient states she uses none for birth control. Patient's last menstrual period was 06/05/2023 (exact date).. Negative for Dysmenorrhea. Negative for: breast discharge, breast lump(s), breast pain and breast self exam. Associated symptoms include abnormal vaginal bleeding. Pertinent negatives include abnormal bleeding (hematology), anxiety, decreased libido, depression, difficulty falling sleep, dyspareunia, history of infertility, nocturia, sexual dysfunction, sleep disturbances, urinary incontinence, urinary urgency, vaginal discharge and vaginal itching. Diet regular.The patient states her exercise level is  intermittent.  . The patient's tobacco use is:  Social History   Tobacco Use  Smoking Status Never  Smokeless Tobacco Never  . She has been exposed to passive smoke. The patient's alcohol use is:  Social History   Substance and Sexual Activity  Alcohol Use Yes   Comment: rarely    Review of Systems  Constitutional: Negative.   HENT: Negative.    Eyes: Negative.  Negative for blurred vision.  Respiratory: Negative.  Negative for shortness of breath.   Cardiovascular: Negative.  Negative for chest pain and orthopnea.  Gastrointestinal: Negative.   Endocrine: Negative.   Genitourinary: Negative.   Musculoskeletal:  Negative.   Skin: Negative.   Allergic/Immunologic: Negative.   Neurological: Negative.   Hematological: Negative.   Psychiatric/Behavioral: Negative.       Today's Vitals   04/10/24 1030  BP: 138/88  Pulse: 96  Temp: 98.6 F (37 C)  SpO2: 98%  Weight: (!) 476 lb 6.4 oz (216.1 kg)  Height: 5' 2 (1.575 m)   Body mass index is 87.13 kg/m.  Wt Readings from Last 3 Encounters:  04/10/24 (!) 476 lb 6.4 oz (216.1 kg)  07/20/23 (!) 447 lb 6.4 oz (202.9 kg)  11/16/22 (!) 452 lb 3.2 oz (205.1 kg)     Objective:  Physical Exam Vitals and nursing note reviewed.  Constitutional:      Appearance: Normal appearance. She is obese.     Comments: Exam performed while seated in chair  HENT:     Head: Normocephalic and atraumatic.     Right Ear: Ear canal and external ear normal. There is impacted cerumen.     Left Ear: Ear canal  and external ear normal. There is impacted cerumen.  Eyes:     Extraocular Movements: Extraocular movements intact.     Conjunctiva/sclera: Conjunctivae normal.     Pupils: Pupils are equal, round, and reactive to light.  Cardiovascular:     Rate and Rhythm: Normal rate and regular rhythm.     Pulses: Normal pulses.     Heart sounds: Normal heart sounds.  Pulmonary:     Effort: Pulmonary effort is normal.     Breath sounds: Normal breath sounds.  Chest:  Breasts:    Tanner Score is 5.     Right: Normal.     Left: Normal.     Comments: Pendulous b/l Abdominal:     General: Bowel sounds are normal.     Palpations: Abdomen is soft.     Comments: Soft, obese, difficult to assess organomegaly  Genitourinary:    Comments: deferred Musculoskeletal:        General: Normal range of motion.     Cervical back: Normal range of motion and neck supple.     Comments: Soft tissue mass RLE  Skin:    General: Skin is warm and dry.  Neurological:     General: No focal deficit present.     Mental Status: She is alert and oriented to person, place, and time.   Psychiatric:        Mood and Affect: Mood normal.        Behavior: Behavior normal.         Assessment And Plan:     Annual physical exam Assessment & Plan: A full exam was performed.  Importance of monthly self breast exams was discussed with the patient.  She is advised to get 30-45 minutes of regular exercise, no less than four to five days per week. Both weight-bearing and aerobic exercises are recommended.  She is advised to follow a healthy diet with at least six fruits/veggies per day, decrease intake of red meat and other saturated fats and to increase fish intake to twice weekly.  Meats/fish should not be fried -- baked, boiled or broiled is preferable. It is also important to cut back on your sugar intake.  Be sure to read labels - try to avoid anything with added sugar, high fructose corn syrup or other sweeteners.  If you must use a sweetener, you can try stevia or monkfruit.  It is also important to avoid artificially sweetened foods/beverages and diet drinks. Lastly, wear SPF 50 sunscreen on exposed skin and when in direct sunlight for an extended period of time.  Be sure to avoid fast food restaurants and aim for at least 60 ounces of water daily.      Orders: -     CBC -     CMP14+EGFR -     Lipid panel  Essential hypertension Assessment & Plan: Chronic, fair control. Goal BP <130/80.  EKG performed, NSR w/o acute changes.  She is  currently on valsartan  80mg  daily. Recently lapse in medication due to pharmacy issues.   - Will refill valsartan  80mg  daily. - Follow low sodium diet.  - No med changes today.  - She is encouraged to incorporate more exercise into her daily routine.  - She will f/u in 4-6 months for re-evaluation.   Orders: -     POCT urinalysis dipstick -     Microalbumin / creatinine urine ratio -     EKG 12-Lead  Primary hypothyroidism Assessment & Plan: Chronic, I will check  a thyroid  panel and adjust meds as needed. For now, she will continue with  Synthroid  150mcg daily.  She will f/ uin four to six months for re-evaluation.   Orders: -     TSH + free T4  Irregular menses Assessment & Plan: She is perimenopausal.  Thyroid  function to be evaluated as a potential contributing factor. - Ordered thyroid  function tests.  Orders: -     Prolactin -     Insulin , random -     FSH/LH  Bilateral impacted cerumen Assessment & Plan: Causing occasional hearing difficulties. - Flushed ears to remove impacted cerumen. - After obtaining verbal consent, both ears were flushed by irrigation. No TM abnormalities were noted. He tolerated procedure well without any complications.     Orders: -     Ear Lavage  Recurrent major depressive disorder, in partial remission Assessment & Plan: Chronic, she is currently on sertraline. She will continue with sertraline 100mg  daily.  - Importance of medication compliance was discussed with the patient.    Screen for colon cancer -     Ambulatory referral to Gastroenterology  Other orders -     Valsartan ; Take 1 tablet (80 mg total) by mouth daily.  Dispense: 90 tablet; Refill: 1  Screening for malignant neoplasm of colon Family history of colon cancer and polyps. Colonoscopy recommended due to family history and BMI considerations. Procedure to be performed in a hospital setting due to BMI and anesthesia requirements. - Referred to Dr. Rollin for initial consultation. - Will schedule colonoscopy in hospital setting.  Return for 1 year physical, 6 month bp. Patient was given opportunity to ask questions. Patient verbalized understanding of the plan and was able to repeat key elements of the plan. All questions were answered to their satisfaction.   I, Catheryn LOISE Slocumb, MD, have reviewed all documentation for this visit. The documentation on 04/10/24 for the exam, diagnosis, procedures, and orders are all accurate and complete.

## 2024-04-10 NOTE — Patient Instructions (Signed)

## 2024-04-11 LAB — CMP14+EGFR
ALT: 18 IU/L (ref 0–32)
AST: 26 IU/L (ref 0–40)
Albumin: 4.3 g/dL (ref 3.9–4.9)
Alkaline Phosphatase: 70 IU/L (ref 41–116)
BUN/Creatinine Ratio: 15 (ref 9–23)
BUN: 11 mg/dL (ref 6–24)
Bilirubin Total: 0.7 mg/dL (ref 0.0–1.2)
CO2: 24 mmol/L (ref 20–29)
Calcium: 9.4 mg/dL (ref 8.7–10.2)
Chloride: 100 mmol/L (ref 96–106)
Creatinine, Ser: 0.75 mg/dL (ref 0.57–1.00)
Globulin, Total: 3 g/dL (ref 1.5–4.5)
Glucose: 106 mg/dL — ABNORMAL HIGH (ref 70–99)
Potassium: 4.5 mmol/L (ref 3.5–5.2)
Sodium: 138 mmol/L (ref 134–144)
Total Protein: 7.3 g/dL (ref 6.0–8.5)
eGFR: 100 mL/min/1.73 (ref >59)

## 2024-04-11 LAB — CBC
Hematocrit: 44.8 % (ref 34.0–46.6)
Hemoglobin: 14.4 g/dL (ref 11.1–15.9)
MCH: 27.9 pg (ref 26.6–33.0)
MCHC: 32.1 g/dL (ref 31.5–35.7)
MCV: 87 fL (ref 79–97)
Platelets: 237 x10E3/uL (ref 150–450)
RBC: 5.16 x10E6/uL (ref 3.77–5.28)
RDW: 13.8 % (ref 11.7–15.4)
WBC: 6 x10E3/uL (ref 3.4–10.8)

## 2024-04-11 LAB — LIPID PANEL
Chol/HDL Ratio: 3.1 ratio (ref 0.0–4.4)
Cholesterol, Total: 194 mg/dL (ref 100–199)
HDL: 62 mg/dL (ref >39)
LDL Chol Calc (NIH): 118 mg/dL — ABNORMAL HIGH (ref 0–99)
Triglycerides: 75 mg/dL (ref 0–149)
VLDL Cholesterol Cal: 14 mg/dL (ref 5–40)

## 2024-04-11 LAB — INSULIN, RANDOM: INSULIN: 21.4 u[IU]/mL (ref 2.6–24.9)

## 2024-04-11 LAB — FSH/LH
FSH: 4.6 m[IU]/mL
LH: 3 m[IU]/mL

## 2024-04-11 LAB — TSH+FREE T4
Free T4: 1.37 ng/dL (ref 0.82–1.77)
TSH: 2.76 u[IU]/mL (ref 0.450–4.500)

## 2024-04-11 LAB — PROLACTIN: Prolactin: 9 ng/mL (ref 4.8–33.4)

## 2024-04-12 ENCOUNTER — Ambulatory Visit: Payer: Self-pay | Admitting: Internal Medicine

## 2024-04-12 LAB — MICROALBUMIN / CREATININE URINE RATIO
Creatinine, Urine: 228.2 mg/dL
Microalb/Creat Ratio: 12 mg/g{creat} (ref 0–29)
Microalbumin, Urine: 28.4 ug/mL

## 2024-04-16 DIAGNOSIS — H6123 Impacted cerumen, bilateral: Secondary | ICD-10-CM | POA: Insufficient documentation

## 2024-04-16 NOTE — Assessment & Plan Note (Signed)

## 2024-04-16 NOTE — Assessment & Plan Note (Signed)
 Chronic, she is currently on sertraline. She will continue with sertraline 100mg  daily.  - Importance of medication compliance was discussed with the patient.

## 2024-04-16 NOTE — Assessment & Plan Note (Addendum)
 Chronic, fair control. Goal BP <130/80.  EKG performed, NSR w/o acute changes.  She is  currently on valsartan  80mg  daily. Recently lapse in medication due to pharmacy issues.   - Will refill valsartan  80mg  daily. - Follow low sodium diet.  - No med changes today.  - She is encouraged to incorporate more exercise into her daily routine.  - She will f/u in 4-6 months for re-evaluation.

## 2024-04-16 NOTE — Assessment & Plan Note (Signed)
 Chronic, I will check a thyroid panel and adjust meds as needed. For now, she will continue with Synthroid daily.  She will f/ uin four to six months for re-evaluation.

## 2024-04-16 NOTE — Assessment & Plan Note (Signed)
 Causing occasional hearing difficulties. - Flushed ears to remove impacted cerumen. - After obtaining verbal consent, both ears were flushed by irrigation. No TM abnormalities were noted. He tolerated procedure well without any complications.

## 2024-04-16 NOTE — Assessment & Plan Note (Signed)
 She is perimenopausal.  Thyroid  function to be evaluated as a potential contributing factor. - Ordered thyroid  function tests.

## 2024-04-28 ENCOUNTER — Other Ambulatory Visit: Payer: Self-pay | Admitting: Internal Medicine

## 2024-10-08 ENCOUNTER — Ambulatory Visit: Admitting: Internal Medicine

## 2025-04-17 ENCOUNTER — Encounter: Payer: Self-pay | Admitting: Internal Medicine
